# Patient Record
Sex: Male | Born: 1960 | Race: White | Hispanic: No | Marital: Married | Smoking: Never smoker
Health system: Southern US, Community
[De-identification: ages and names within clinical notes are randomized; demographics above are authoritative.]

## PROBLEM LIST (undated history)

## (undated) DIAGNOSIS — M6740 Ganglion, unspecified site: Secondary | ICD-10-CM

## (undated) DIAGNOSIS — K649 Unspecified hemorrhoids: Secondary | ICD-10-CM

## (undated) DIAGNOSIS — K5900 Constipation, unspecified: Secondary | ICD-10-CM

## (undated) DIAGNOSIS — E669 Obesity, unspecified: Secondary | ICD-10-CM

## (undated) DIAGNOSIS — Z8489 Family history of other specified conditions: Secondary | ICD-10-CM

## (undated) DIAGNOSIS — I10 Essential (primary) hypertension: Secondary | ICD-10-CM

## (undated) DIAGNOSIS — K760 Fatty (change of) liver, not elsewhere classified: Secondary | ICD-10-CM

## (undated) DIAGNOSIS — T883XXA Malignant hyperthermia due to anesthesia, initial encounter: Secondary | ICD-10-CM

## (undated) DIAGNOSIS — R911 Solitary pulmonary nodule: Secondary | ICD-10-CM

## (undated) DIAGNOSIS — J189 Pneumonia, unspecified organism: Secondary | ICD-10-CM

## (undated) DIAGNOSIS — C61 Malignant neoplasm of prostate: Secondary | ICD-10-CM

## (undated) DIAGNOSIS — K432 Incisional hernia without obstruction or gangrene: Secondary | ICD-10-CM

## (undated) DIAGNOSIS — R1031 Right lower quadrant pain: Secondary | ICD-10-CM

## (undated) DIAGNOSIS — K5790 Diverticulosis of intestine, part unspecified, without perforation or abscess without bleeding: Secondary | ICD-10-CM

## (undated) DIAGNOSIS — M549 Dorsalgia, unspecified: Secondary | ICD-10-CM

## (undated) DIAGNOSIS — R011 Cardiac murmur, unspecified: Secondary | ICD-10-CM

## (undated) DIAGNOSIS — M1990 Unspecified osteoarthritis, unspecified site: Secondary | ICD-10-CM

## (undated) DIAGNOSIS — G4730 Sleep apnea, unspecified: Secondary | ICD-10-CM

## (undated) DIAGNOSIS — G8929 Other chronic pain: Secondary | ICD-10-CM

## (undated) DIAGNOSIS — R7303 Prediabetes: Secondary | ICD-10-CM

## (undated) DIAGNOSIS — K219 Gastro-esophageal reflux disease without esophagitis: Secondary | ICD-10-CM

## (undated) DIAGNOSIS — K589 Irritable bowel syndrome without diarrhea: Secondary | ICD-10-CM

## (undated) HISTORY — DX: Family history of other specified conditions: Z84.89

## (undated) HISTORY — PX: COLONOSCOPY: SHX174

## (undated) HISTORY — DX: Unspecified osteoarthritis, unspecified site: M19.90

## (undated) HISTORY — DX: Prediabetes: R73.03

## (undated) HISTORY — DX: Essential (primary) hypertension: I10

## (undated) HISTORY — DX: Cardiac murmur, unspecified: R01.1

---

## 2010-02-26 HISTORY — PX: APPENDECTOMY: SHX54

## 2010-03-06 ENCOUNTER — Emergency Department (HOSPITAL_BASED_OUTPATIENT_CLINIC_OR_DEPARTMENT_OTHER): Admission: EM | Admit: 2010-03-06 | Discharge: 2010-03-06 | Payer: Self-pay | Admitting: Emergency Medicine

## 2010-03-06 ENCOUNTER — Ambulatory Visit: Payer: Self-pay | Admitting: Diagnostic Radiology

## 2010-09-06 LAB — URINALYSIS, ROUTINE W REFLEX MICROSCOPIC
Bilirubin Urine: NEGATIVE
Glucose, UA: NEGATIVE mg/dL
Ketones, ur: NEGATIVE mg/dL
Protein, ur: NEGATIVE mg/dL
pH: 5 (ref 5.0–8.0)

## 2010-09-06 LAB — COMPREHENSIVE METABOLIC PANEL
ALT: 51 U/L (ref 0–53)
AST: 47 U/L — ABNORMAL HIGH (ref 0–37)
Alkaline Phosphatase: 76 U/L (ref 39–117)
CO2: 27 mEq/L (ref 19–32)
GFR calc Af Amer: >60 mL/min (ref >60)
GFR calc non Af Amer: >60 mL/min (ref >60)
Glucose, Bld: 118 mg/dL — ABNORMAL HIGH (ref 70–99)
Potassium: 4.5 mEq/L (ref 3.5–5.1)
Sodium: 142 mEq/L (ref 135–145)

## 2010-09-06 LAB — CBC
HCT: 40.1 % (ref 39.0–52.0)
Hemoglobin: 13.7 g/dL (ref 13.0–17.0)
RBC: 4.67 MIL/uL (ref 4.22–5.81)
WBC: 15.9 10*3/uL — ABNORMAL HIGH (ref 4.0–10.5)

## 2010-09-06 LAB — CULTURE, BLOOD (ROUTINE X 2): Culture  Setup Time: 201109140214

## 2010-09-06 LAB — DIFFERENTIAL
Basophils Relative: 1 % (ref 0–1)
Eosinophils Absolute: 0.2 10*3/uL (ref 0.0–0.7)
Eosinophils Relative: 1 % (ref 0–5)
Monocytes Relative: 6 % (ref 3–12)
Neutrophils Relative %: 79 % — ABNORMAL HIGH (ref 43–77)

## 2010-09-06 LAB — LIPASE, BLOOD: Lipase: 56 U/L (ref 23–300)

## 2011-11-15 ENCOUNTER — Encounter (INDEPENDENT_AMBULATORY_CARE_PROVIDER_SITE_OTHER): Payer: Self-pay | Admitting: Surgery

## 2011-11-20 ENCOUNTER — Ambulatory Visit (INDEPENDENT_AMBULATORY_CARE_PROVIDER_SITE_OTHER): Payer: PRIVATE HEALTH INSURANCE | Admitting: Surgery

## 2011-11-20 ENCOUNTER — Encounter (INDEPENDENT_AMBULATORY_CARE_PROVIDER_SITE_OTHER): Payer: Self-pay | Admitting: Surgery

## 2011-11-20 VITALS — BP 132/96 | HR 72 | Temp 98.8°F | Ht 75.0 in | Wt 305.0 lb

## 2011-11-20 DIAGNOSIS — Z9889 Other specified postprocedural states: Secondary | ICD-10-CM

## 2011-11-20 DIAGNOSIS — K432 Incisional hernia without obstruction or gangrene: Secondary | ICD-10-CM

## 2011-11-20 DIAGNOSIS — Z9049 Acquired absence of other specified parts of digestive tract: Secondary | ICD-10-CM | POA: Insufficient documentation

## 2011-11-20 NOTE — Progress Notes (Signed)
Subjective:     Patient ID: Edward Arroyo, male   DOB: 09-06-1960, 51 y.o.   MRN: [id number]  HPI Mr. Gipe had any open appendectomy in [place] in September of 2011. I followed him for a postoperative wound infection until the wound totally healed. Since then he has always noticed a fullness in the right lower quadrant. He is now having increasing pain in the area and constipation. He also has occasional nausea. He had a colonoscopy 3 years ago which was normal  Review of Systems     Objective:   Physical Exam On exam, his abdomen is obese. His incision is well-healed. There is a moderate fullness around the incision which is tender. I cannot reduce this. It is difficult to tell there is a fascial defect. The rest of his abdomen is soft and nontender    Assessment:     Probable incisional hernia    Plan:     I discussed going ahead and proceeding with a diagnostic laparoscopy versus a CAT scan of the abdomen and pelvis. He was proceed conservatively which fills very reasonable. We will ask a CAT scan of the abdomen and pelvis with contrast to indeed see whether there is a hernia present or some other intra-abdominal abnormality creating this fullness and symptoms.

## 2011-11-28 ENCOUNTER — Ambulatory Visit
Admission: RE | Admit: 2011-11-28 | Discharge: 2011-11-28 | Disposition: A | Discharge status: Home / Self Care | Payer: PRIVATE HEALTH INSURANCE | Source: Ambulatory Visit | Attending: Surgery | Admitting: Surgery

## 2011-11-28 DIAGNOSIS — K432 Incisional hernia without obstruction or gangrene: Secondary | ICD-10-CM

## 2011-11-28 MED ORDER — IOHEXOL 300 MG/ML  SOLN
125.0000 mL | Freq: Once | INTRAMUSCULAR | Status: AC | PRN
  Administered 2011-11-28: 125 mL via INTRAVENOUS

## 2011-11-29 ENCOUNTER — Other Ambulatory Visit (INDEPENDENT_AMBULATORY_CARE_PROVIDER_SITE_OTHER): Payer: Self-pay | Admitting: Surgery

## 2011-12-30 ENCOUNTER — Telehealth (INDEPENDENT_AMBULATORY_CARE_PROVIDER_SITE_OTHER): Payer: Self-pay | Admitting: General Surgery

## 2011-12-30 NOTE — Telephone Encounter (Signed)
Pt calling to describe pain (intermitant and sharp) on the Lt side.  He is passing gas, but admits he in not having regular bowel movements.  Recommended he try Miralax 1-2 capfuls in 10 oz fluid, 1-2 times a day.  Continue until having regular BMs, then can decrease to 1 capful in 10oz daily.  He understands and will try; will call back if no improvement.

## 2012-01-08 ENCOUNTER — Encounter (HOSPITAL_COMMUNITY): Payer: Self-pay | Admitting: Pharmacy Technician

## 2012-01-08 ENCOUNTER — Telehealth (INDEPENDENT_AMBULATORY_CARE_PROVIDER_SITE_OTHER): Payer: Self-pay | Admitting: General Surgery

## 2012-01-08 NOTE — Telephone Encounter (Signed)
Pt called this morning and wanted to know if he do anything else for his bowel movement. I was working Dr Janee Morn this morning and I asked him what the pt could take for his bowels and he stated that pt could do Milk O Magnesia. Pt was not having any fever or nausea and vomiting. He stated that Miralax was not working for him. Pt is schedule for hernia surgery on July 24

## 2012-01-10 ENCOUNTER — Ambulatory Visit (HOSPITAL_COMMUNITY)
Admission: RE | Admit: 2012-01-10 | Discharge: 2012-01-10 | Disposition: A | Discharge status: Home / Self Care | Payer: PRIVATE HEALTH INSURANCE | Source: Ambulatory Visit | Attending: Surgery | Admitting: Surgery

## 2012-01-10 ENCOUNTER — Encounter (HOSPITAL_COMMUNITY): Payer: Self-pay

## 2012-01-10 ENCOUNTER — Encounter (HOSPITAL_COMMUNITY)
Admission: RE | Admit: 2012-01-10 | Discharge: 2012-01-10 | Disposition: A | Discharge status: Home / Self Care | Payer: PRIVATE HEALTH INSURANCE | Source: Ambulatory Visit | Attending: Surgery | Admitting: Surgery

## 2012-01-10 DIAGNOSIS — R911 Solitary pulmonary nodule: Secondary | ICD-10-CM | POA: Insufficient documentation

## 2012-01-10 DIAGNOSIS — Z01812 Encounter for preprocedural laboratory examination: Secondary | ICD-10-CM | POA: Insufficient documentation

## 2012-01-10 DIAGNOSIS — K432 Incisional hernia without obstruction or gangrene: Secondary | ICD-10-CM | POA: Insufficient documentation

## 2012-01-10 DIAGNOSIS — I10 Essential (primary) hypertension: Secondary | ICD-10-CM | POA: Insufficient documentation

## 2012-01-10 HISTORY — DX: Sleep apnea, unspecified: G47.30

## 2012-01-10 HISTORY — DX: Family history of other specified conditions: Z84.89

## 2012-01-10 HISTORY — DX: Gastro-esophageal reflux disease without esophagitis: K21.9

## 2012-01-10 HISTORY — DX: Malignant hyperthermia due to anesthesia, initial encounter: T88.3XXA

## 2012-01-10 HISTORY — DX: Incisional hernia without obstruction or gangrene: K43.2

## 2012-01-10 HISTORY — DX: Unspecified hemorrhoids: K64.9

## 2012-01-10 HISTORY — DX: Constipation, unspecified: K59.00

## 2012-01-10 LAB — BASIC METABOLIC PANEL
BUN: 14 mg/dL (ref 6–23)
CO2: 26 mEq/L (ref 19–32)
Chloride: 102 mEq/L (ref 96–112)
GFR calc non Af Amer: >90 mL/min (ref >90)
Glucose, Bld: 132 mg/dL — ABNORMAL HIGH (ref 70–99)
Potassium: 3.8 mEq/L (ref 3.5–5.1)
Sodium: 138 mEq/L (ref 135–145)

## 2012-01-10 LAB — CBC
HCT: 41.9 % (ref 39.0–52.0)
Hemoglobin: 14 g/dL (ref 13.0–17.0)
MCHC: 33.4 g/dL (ref 30.0–36.0)
RBC: 4.89 MIL/uL (ref 4.22–5.81)

## 2012-01-10 LAB — SURGICAL PCR SCREEN
MRSA, PCR: NEGATIVE
Staphylococcus aureus: NEGATIVE

## 2012-01-10 NOTE — Pre-Procedure Instructions (Signed)
PT HAS EKG REPORT FROM DR. BURNETT - DONE 07/26/11.  REPORT ON PT'S CHART. CXR, CBC, BMET WERE DONE TODAY AT [facility] PREOP. PREOP TEACHING WAS DISCUSSED WITH PT USING THE TEACH BACK METHOD.

## 2012-01-10 NOTE — Progress Notes (Signed)
01/10/12 0900  OBSTRUCTIVE SLEEP APNEA  Have you ever been diagnosed with sleep apnea through a sleep study? No  Do you snore loudly (loud enough to be heard through closed doors)?  0  Do you often feel tired, fatigued, or sleepy during the daytime? 0  Has anyone observed you stop breathing during your sleep? 0  Do you have, or are you being treated for high blood pressure? 1  BMI more than 35 kg/m2? 1  Age over 51 years old? 1  Neck circumference greater than 40 cm/18 inches? 1 (18.5")  Gender: 1  Obstructive Sleep Apnea Score 5   Score 4 or greater  Updated health history

## 2012-01-10 NOTE — Patient Instructions (Signed)
YOUR SURGERY IS SCHEDULED ON:  WED  7/24  AT 8:30 AM  REPORT TO [facility] AT: 6:30 AM      PHONE # FOR SHORT STAY IS [phone]  DO NOT EAT OR DRINK ANYTHING AFTER MIDNIGHT THE NIGHT BEFORE YOUR SURGERY.  YOU MAY BRUSH YOUR TEETH, RINSE OUT YOUR MOUTH--BUT NO WATER, NO FOOD, NO CHEWING GUM, NO MINTS, NO CANDIES, NO CHEWING TOBACCO.  PLEASE TAKE THE FOLLOWING MEDICATIONS THE AM OF YOUR SURGERY WITH A FEW SIPS OF WATER: NO MEDICINES TO TAKE THE AM OF YOUR SURGERY    IF YOU USE INHALERS--USE YOUR INHALERS THE AM OF YOUR SURGERY AND BRING INHALERS TO THE HOSPITAL -TAKE TO SURGERY.    IF YOU ARE DIABETIC:  DO NOT TAKE ANY DIABETIC MEDICATIONS THE AM OF YOUR SURGERY.  IF YOU TAKE INSULIN IN THE EVENINGS--PLEASE ONLY TAKE 1/2 NORMAL EVENING DOSE THE NIGHT BEFORE YOUR SURGERY.  NO INSULIN THE AM OF YOUR SURGERY.  IF YOU HAVE SLEEP APNEA AND USE CPAP OR BIPAP--PLEASE BRING THE MASK --NOT THE MACHINE-NOT THE TUBING   -JUST THE MASK. DO NOT BRING VALUABLES, MONEY, CREDIT CARDS.  CONTACT LENS, DENTURES / PARTIALS, GLASSES SHOULD NOT BE WORN TO SURGERY AND IN MOST CASES-HEARING AIDS WILL NEED TO BE REMOVED.  BRING YOUR GLASSES CASE, ANY EQUIPMENT NEEDED FOR YOUR CONTACT LENS. FOR PATIENTS ADMITTED TO THE HOSPITAL--CHECK OUT TIME THE DAY OF DISCHARGE IS 11:00 AM.  ALL INPATIENT ROOMS ARE PRIVATE - WITH BATHROOM, TELEPHONE, TELEVISION AND WIFI INTERNET. IF YOU ARE BEING DISCHARGED THE SAME DAY OF YOUR SURGERY--YOU CAN NOT DRIVE YOURSELF HOME--AND SHOULD NOT GO HOME ALONE BY TAXI OR BUS.  NO DRIVING OR OPERATING MACHINERY FOR 24 HOURS FOLLOWING ANESTHESIA / PAIN MEDICATIONS.                            SPECIAL INSTRUCTIONS:  CHLORHEXIDINE SOAP SHOWER (other brand names are Betasept and Hibiclens ) PLEASE SHOWER WITH CHLORHEXIDINE THE NIGHT BEFORE YOUR SURGERY AND THE AM OF YOUR SURGERY. DO NOT USE CHLORHEXIDINE ON YOUR FACE OR PRIVATE AREAS--YOU MAY USE YOUR NORMAL SOAP THOSE AREAS AND  YOUR NORMAL SHAMPOO.  WOMEN SHOULD AVOID SHAVING UNDER ARMS AND SHAVING LEGS 48 HOURS BEFORE USING CHLORHEXIDINE TO AVOID SKIN IRRITATION.  DO NOT USE IF ALLERGIC TO CHLORHEXIDINE.  PLEASE READ OVER ANY  FACT SHEETS THAT YOU WERE GIVEN: MRSA INFORMATION

## 2012-01-13 ENCOUNTER — Encounter (HOSPITAL_COMMUNITY): Payer: Self-pay

## 2012-01-13 NOTE — Pre-Procedure Instructions (Signed)
DR. ROSE NOTIFIED OF PT'S SURGERY PLANNED FOR 01/15/12 AND PT'S GRANDSON HAS POSSIBLE HX OF MALIGNANT HYPERTHERMIA--GRANDSON WAS TO HAVE MUSCLE BIOPSY 01/10/12.  OR POSTING NOTIFIED AND WILL PUT A NOTE ON OR SCHEDULE.  ALSO A NOTE IS ON FRONT OF PT'S CHART.

## 2012-01-15 ENCOUNTER — Encounter (HOSPITAL_COMMUNITY): Payer: Self-pay | Admitting: *Deleted

## 2012-01-15 ENCOUNTER — Ambulatory Visit (HOSPITAL_COMMUNITY): Payer: PRIVATE HEALTH INSURANCE | Admitting: Certified Registered Nurse Anesthetist

## 2012-01-15 ENCOUNTER — Encounter (HOSPITAL_COMMUNITY): Payer: Self-pay | Admitting: Certified Registered Nurse Anesthetist

## 2012-01-15 ENCOUNTER — Observation Stay (HOSPITAL_COMMUNITY)
Admission: RE | Admit: 2012-01-15 | Discharge: 2012-01-17 | Disposition: A | Discharge status: Home / Self Care | Payer: PRIVATE HEALTH INSURANCE | Source: Ambulatory Visit | Attending: Surgery | Admitting: Surgery

## 2012-01-15 ENCOUNTER — Encounter (HOSPITAL_COMMUNITY)
Admission: RE | Disposition: A | Discharge status: Home / Self Care | Payer: Self-pay | Source: Ambulatory Visit | Attending: Surgery

## 2012-01-15 DIAGNOSIS — K5900 Constipation, unspecified: Secondary | ICD-10-CM | POA: Insufficient documentation

## 2012-01-15 DIAGNOSIS — I10 Essential (primary) hypertension: Secondary | ICD-10-CM | POA: Insufficient documentation

## 2012-01-15 DIAGNOSIS — K219 Gastro-esophageal reflux disease without esophagitis: Secondary | ICD-10-CM | POA: Insufficient documentation

## 2012-01-15 DIAGNOSIS — E785 Hyperlipidemia, unspecified: Secondary | ICD-10-CM | POA: Insufficient documentation

## 2012-01-15 DIAGNOSIS — K432 Incisional hernia without obstruction or gangrene: Principal | ICD-10-CM | POA: Insufficient documentation

## 2012-01-15 DIAGNOSIS — R7309 Other abnormal glucose: Secondary | ICD-10-CM | POA: Insufficient documentation

## 2012-01-15 DIAGNOSIS — G4730 Sleep apnea, unspecified: Secondary | ICD-10-CM | POA: Insufficient documentation

## 2012-01-15 HISTORY — PX: INCISIONAL HERNIA REPAIR: SHX193

## 2012-01-15 LAB — GLUCOSE, CAPILLARY: Glucose-Capillary: 160 mg/dL — ABNORMAL HIGH (ref 70–99)

## 2012-01-15 SURGERY — REPAIR, HERNIA, INCISIONAL, LAPAROSCOPIC
Anesthesia: General

## 2012-01-15 MED ORDER — GLYCOPYRROLATE 0.2 MG/ML IJ SOLN
INTRAMUSCULAR | Status: DC | PRN
  Administered 2012-01-15: .8 mg via INTRAVENOUS

## 2012-01-15 MED ORDER — ONDANSETRON HCL 4 MG/2ML IJ SOLN
INTRAMUSCULAR | Status: DC | PRN
  Administered 2012-01-15: 4 mg via INTRAVENOUS

## 2012-01-15 MED ORDER — LACTATED RINGERS IV SOLN: INTRAVENOUS | Status: DC

## 2012-01-15 MED ORDER — POTASSIUM CHLORIDE IN NACL 20-0.9 MEQ/L-% IV SOLN
INTRAVENOUS | Status: DC
  Administered 2012-01-15: 12:00:00 via INTRAVENOUS
  Administered 2012-01-16: 75 mL via INTRAVENOUS
  Filled 2012-01-15 (×3): qty 1000

## 2012-01-15 MED ORDER — ACETAMINOPHEN 10 MG/ML IV SOLN
INTRAVENOUS | Status: AC
  Filled 2012-01-15: qty 100

## 2012-01-15 MED ORDER — EPHEDRINE SULFATE 50 MG/ML IJ SOLN
INTRAMUSCULAR | Status: DC | PRN
  Administered 2012-01-15 (×2): 10 mg via INTRAVENOUS
  Administered 2012-01-15: 5 mg via INTRAVENOUS

## 2012-01-15 MED ORDER — NEOSTIGMINE METHYLSULFATE 1 MG/ML IJ SOLN
INTRAMUSCULAR | Status: DC | PRN
  Administered 2012-01-15: 5 mg via INTRAVENOUS

## 2012-01-15 MED ORDER — MIDAZOLAM HCL 5 MG/5ML IJ SOLN
INTRAMUSCULAR | Status: DC | PRN
  Administered 2012-01-15: 2 mg via INTRAVENOUS

## 2012-01-15 MED ORDER — OXYCODONE-ACETAMINOPHEN 5-325 MG PO TABS: 1.0000 | ORAL_TABLET | ORAL | Status: DC | PRN

## 2012-01-15 MED ORDER — CEFAZOLIN SODIUM-DEXTROSE 2-3 GM-% IV SOLR
INTRAVENOUS | Status: AC
  Filled 2012-01-15: qty 50

## 2012-01-15 MED ORDER — ACETAMINOPHEN 10 MG/ML IV SOLN
INTRAVENOUS | Status: DC | PRN
  Administered 2012-01-15: 1000 mg via INTRAVENOUS

## 2012-01-15 MED ORDER — IRBESARTAN 150 MG PO TABS
150.0000 mg | ORAL_TABLET | Freq: Every day | ORAL | Status: DC
  Administered 2012-01-15 – 2012-01-17 (×3): 150 mg via ORAL
  Filled 2012-01-15 (×3): qty 1

## 2012-01-15 MED ORDER — PROPOFOL 10 MG/ML IV EMUL
INTRAVENOUS | Status: DC | PRN
  Administered 2012-01-15: 250 mg via INTRAVENOUS

## 2012-01-15 MED ORDER — FENTANYL CITRATE 0.05 MG/ML IJ SOLN
INTRAMUSCULAR | Status: DC | PRN
  Administered 2012-01-15: 100 ug via INTRAVENOUS
  Administered 2012-01-15 (×3): 50 ug via INTRAVENOUS

## 2012-01-15 MED ORDER — ROCURONIUM BROMIDE 100 MG/10ML IV SOLN
INTRAVENOUS | Status: DC | PRN
  Administered 2012-01-15: 40 mg via INTRAVENOUS

## 2012-01-15 MED ORDER — BUPIVACAINE HCL (PF) 0.5 % IJ SOLN
INTRAMUSCULAR | Status: DC | PRN
  Administered 2012-01-15: 26 mL

## 2012-01-15 MED ORDER — HYDROCHLOROTHIAZIDE 12.5 MG PO CAPS
12.5000 mg | ORAL_CAPSULE | Freq: Every day | ORAL | Status: DC
  Administered 2012-01-15 – 2012-01-17 (×3): 12.5 mg via ORAL
  Filled 2012-01-15 (×3): qty 1

## 2012-01-15 MED ORDER — ONDANSETRON HCL 4 MG/2ML IJ SOLN
4.0000 mg | Freq: Four times a day (QID) | INTRAMUSCULAR | Status: DC | PRN
  Administered 2012-01-15: 4 mg via INTRAVENOUS
  Filled 2012-01-15: qty 2

## 2012-01-15 MED ORDER — CEFAZOLIN SODIUM 1-5 GM-% IV SOLN
INTRAVENOUS | Status: AC
  Filled 2012-01-15: qty 50

## 2012-01-15 MED ORDER — LIDOCAINE HCL 4 % MT SOLN
OROMUCOSAL | Status: DC | PRN
  Administered 2012-01-15: 4 mL via TOPICAL

## 2012-01-15 MED ORDER — DEXTROSE 5 % IV SOLN
3.0000 g | INTRAVENOUS | Status: AC
  Administered 2012-01-15: 3 g via INTRAVENOUS
  Filled 2012-01-15: qty 3000

## 2012-01-15 MED ORDER — BUPIVACAINE HCL (PF) 0.5 % IJ SOLN
INTRAMUSCULAR | Status: AC
  Filled 2012-01-15: qty 30

## 2012-01-15 MED ORDER — HYDROMORPHONE HCL PF 1 MG/ML IJ SOLN: 1.0000 mg | INTRAMUSCULAR | Status: DC | PRN

## 2012-01-15 MED ORDER — ONDANSETRON HCL 4 MG PO TABS: 4.0000 mg | ORAL_TABLET | Freq: Four times a day (QID) | ORAL | Status: DC | PRN

## 2012-01-15 MED ORDER — LACTATED RINGERS IV SOLN
INTRAVENOUS | Status: DC | PRN
  Administered 2012-01-15: 08:00:00 via INTRAVENOUS

## 2012-01-15 MED ORDER — HYDROMORPHONE HCL PF 1 MG/ML IJ SOLN: 0.2500 mg | INTRAMUSCULAR | Status: DC | PRN

## 2012-01-15 MED ORDER — NAPROXEN 500 MG PO TABS
500.0000 mg | ORAL_TABLET | Freq: Three times a day (TID) | ORAL | Status: DC | PRN
  Filled 2012-01-15: qty 1

## 2012-01-15 MED ORDER — OLMESARTAN MEDOXOMIL-HCTZ 40-25 MG PO TABS: 0.5000 | ORAL_TABLET | Freq: Every day | ORAL | Status: DC

## 2012-01-15 MED ORDER — PROMETHAZINE HCL 25 MG/ML IJ SOLN: 6.2500 mg | INTRAMUSCULAR | Status: DC | PRN

## 2012-01-15 MED ORDER — ENOXAPARIN SODIUM 40 MG/0.4ML ~~LOC~~ SOLN
40.0000 mg | SUBCUTANEOUS | Status: DC
  Administered 2012-01-16: 40 mg via SUBCUTANEOUS
  Filled 2012-01-15 (×3): qty 0.4

## 2012-01-15 MED ORDER — KETOROLAC TROMETHAMINE 30 MG/ML IJ SOLN
30.0000 mg | Freq: Four times a day (QID) | INTRAMUSCULAR | Status: AC
  Administered 2012-01-15 – 2012-01-16 (×3): 30 mg via INTRAVENOUS
  Filled 2012-01-15 (×3): qty 1

## 2012-01-15 SURGICAL SUPPLY — 48 items
APL SKNCLS STERI-STRIP NONHPOA (GAUZE/BANDAGES/DRESSINGS) ×1
BANDAGE ADHESIVE 1X3 (GAUZE/BANDAGES/DRESSINGS) ×6 IMPLANT
BENZOIN TINCTURE PRP APPL 2/3 (GAUZE/BANDAGES/DRESSINGS) ×2 IMPLANT
BINDER ABD UNIV 12 45-62 (WOUND CARE) IMPLANT
BINDER ABDOMINAL 46IN 62IN (WOUND CARE)
CANISTER SUCTION 2500CC (MISCELLANEOUS) ×2 IMPLANT
CANNULA ENDOPATH XCEL 11M (ENDOMECHANICALS) ×2 IMPLANT
CHLORAPREP W/TINT 26ML (MISCELLANEOUS) ×2 IMPLANT
CLOTH BEACON ORANGE TIMEOUT ST (SAFETY) ×2 IMPLANT
CLSR STERI-STRIP ANTIMIC 1/2X4 (GAUZE/BANDAGES/DRESSINGS) ×1 IMPLANT
DECANTER SPIKE VIAL GLASS SM (MISCELLANEOUS) ×2 IMPLANT
DEVICE SECURE STRAP 25 ABSORB (INSTRUMENTS) ×2 IMPLANT
DEVICE TROCAR PUNCTURE CLOSURE (ENDOMECHANICALS) ×2 IMPLANT
DISSECTOR BLUNT TIP ENDO 5MM (MISCELLANEOUS) IMPLANT
DRAPE LAPAROSCOPIC ABDOMINAL (DRAPES) ×2 IMPLANT
DRSG TEGADERM 2-3/8X2-3/4 SM (GAUZE/BANDAGES/DRESSINGS) IMPLANT
ELECT REM PT RETURN 9FT ADLT (ELECTROSURGICAL) ×2
ELECTRODE REM PT RTRN 9FT ADLT (ELECTROSURGICAL) ×1 IMPLANT
GLOVE BIOGEL PI IND STRL 7.0 (GLOVE) ×1 IMPLANT
GLOVE BIOGEL PI INDICATOR 7.0 (GLOVE) ×1
GLOVE SURG SIGNA 7.5 PF LTX (GLOVE) ×4 IMPLANT
GOWN STRL NON-REIN LRG LVL3 (GOWN DISPOSABLE) ×2 IMPLANT
GOWN STRL REIN XL XLG (GOWN DISPOSABLE) ×4 IMPLANT
HAND ACTIVATED (MISCELLANEOUS) IMPLANT
KIT BASIN OR (CUSTOM PROCEDURE TRAY) ×2 IMPLANT
MESH VENTRALIGHT ST 6X8 (Mesh Specialty) ×2 IMPLANT
MESH VENTRLGHT ELLIPSE 8X6XMFL (Mesh Specialty) IMPLANT
NDL INSUFFLATION 14GA 120MM (NEEDLE) IMPLANT
NDL SPNL 22GX3.5 QUINCKE BK (NEEDLE) ×1 IMPLANT
NEEDLE INSUFFLATION 14GA 120MM (NEEDLE) ×2 IMPLANT
NEEDLE SPNL 22GX3.5 QUINCKE BK (NEEDLE) ×2 IMPLANT
NS IRRIG 1000ML POUR BTL (IV SOLUTION) ×2 IMPLANT
PEN SKIN MARKING BROAD (MISCELLANEOUS) ×2 IMPLANT
PENCIL BUTTON HOLSTER BLD 10FT (ELECTRODE) IMPLANT
SCISSORS LAP 5X35 DISP (ENDOMECHANICALS) ×1 IMPLANT
SET IRRIG TUBING LAPAROSCOPIC (IRRIGATION / IRRIGATOR) IMPLANT
SOLUTION ANTI FOG 6CC (MISCELLANEOUS) ×2 IMPLANT
STRIP CLOSURE SKIN 1/2X4 (GAUZE/BANDAGES/DRESSINGS) ×4 IMPLANT
SUT MNCRL AB 4-0 PS2 18 (SUTURE) IMPLANT
SUT NOVA 0 T19/GS 22DT (SUTURE) IMPLANT
SUT NOVA NAB DX-16 0-1 5-0 T12 (SUTURE) ×1 IMPLANT
TOWEL OR 17X26 10 PK STRL BLUE (TOWEL DISPOSABLE) ×2 IMPLANT
TRAY FOLEY CATH 14FRSI W/METER (CATHETERS) ×1 IMPLANT
TRAY LAP CHOLE (CUSTOM PROCEDURE TRAY) ×2 IMPLANT
TROCAR BLADELESS OPT 5 75 (ENDOMECHANICALS) ×3 IMPLANT
TROCAR XCEL BLUNT TIP 100MML (ENDOMECHANICALS) IMPLANT
TROCAR XCEL NON-BLD 11X100MML (ENDOMECHANICALS) ×2 IMPLANT
TUBING INSUFFLATION 10FT LAP (TUBING) ×2 IMPLANT

## 2012-01-15 NOTE — Anesthesia Postprocedure Evaluation (Signed)
Anesthesia Post Note  Patient: Edward Arroyo  Procedure(s) Performed: Procedure(s) (LRB): LAPAROSCOPIC INCISIONAL HERNIA (N/A) INSERTION OF MESH (N/A)  Anesthesia type: General  Patient location: PACU  Post pain: Pain level controlled  Post assessment: Post-op Vital signs reviewed  Last Vitals:  Filed Vitals:   01/15/12 0945  BP: 127/64  Pulse: 53  Temp:   Resp: 16    Post vital signs: Reviewed  Level of consciousness: sedated  Complications: No apparent anesthesia complications

## 2012-01-15 NOTE — Anesthesia Preprocedure Evaluation (Signed)
Anesthesia Evaluation  Patient identified by MRN, date of birth, ID band Patient awake    Reviewed: Allergy & Precautions, H&P , NPO status , Patient's Chart, lab work & pertinent test results  History of Anesthesia Complications Negative for: history of anesthetic complications (Negative final history of anesthetic complications; grandson worked up for [facility] with negative muscle biopsy)  Airway Mallampati: II TM Distance: >3 FB Neck ROM: Full    Dental  (+) Teeth Intact and Dental Advisory Given   Pulmonary neg pulmonary ROS, sleep apnea ,  breath sounds clear to auscultation  Pulmonary exam normal       Cardiovascular hypertension, Rhythm:Regular Rate:Normal     Neuro/Psych negative neurological ROS  negative psych ROS   GI/Hepatic Neg liver ROS, GERD-  ,  Endo/Other  Type obesity  Renal/GU negative Renal ROS  negative genitourinary   Musculoskeletal negative musculoskeletal ROS (+)   Abdominal   Peds negative pediatric ROS (+)  Hematology negative hematology ROS (+)   Anesthesia Other Findings   Reproductive/Obstetrics negative OB ROS                           Anesthesia Physical Anesthesia Plan  ASA: III  Anesthesia Plan: General   Post-op Pain Management:    Induction: Intravenous  Airway Management Planned: Oral ETT  Additional Equipment:   Intra-op Plan:   Post-operative Plan: Extubation in OR  Informed Consent: I have reviewed the patients History and Physical, chart, labs and discussed the procedure including the risks, benefits and alternatives for the proposed anesthesia with the patient or authorized representative who has indicated his/her understanding and acceptance.   Dental advisory given  Plan Discussed with: CRNA  Anesthesia Plan Comments:         Anesthesia Quick Evaluation

## 2012-01-15 NOTE — Op Note (Signed)
LAPAROSCOPIC INCISIONAL HERNIA, INSERTION OF MESH  Procedure Note  Berel Najjar 01/15/2012   Pre-op Diagnosis: incisional hernia     Post-op Diagnosis: same  Procedure(s): LAPAROSCOPIC INCISIONAL HERNIA INSERTION OF MESH (15 cm x 20 cm Bard ventralite)  Surgeon(s): Shelly Rubenstein, MD  Anesthesia: General  Staff:  Jaynee Eagles - Circulator Beryl Meager, CST - Scrub Person Michaela A Hubbard, CST - Scrub Person  Estimated Blood Loss: Minimal               Findings: Patient was found to have a large incisional hernia in the right lower quadrant. There was only omentum fixed to the hernia with no evidence of bowel involvement.  Procedure: The patient was brought to the operating room and identified as the correct patient. He was placed supine on the operating room table and general anesthesia was induced. His abdomen was then prepped and draped in the usual sterile fashion. A small incision was made in the patient's left upper quadrant with a scalpel. I then used the 5 mm Optiview port and camera to sew again accessed through all layers of the abdominal wall and into the peritoneal cavity. Insufflation of the abdomen was then begun. I evaluated the introduction site and saw no evidence of bowel injury. I could easily visualize the large hernia in the right lower quadrant. All the bowel was reduced from the hernia and only omentum was stuck in the hernia sac. A posterior thumb ulnar port in the patient's left lower quadrant an 11 mm port the patient's left mid quadrant.  I then was easily able to take down the minimal omentum fixated in the hernia sac with laparoscopic scissors.  I then measured the size of the defect. A piece of 15 cm x 20 cm Bard ventral light mesh was brought to the field. I placed a 0 Novafil suture in 2 separate corners of the mesh. The mesh was then soaked in saline and introduced through the 11 mm trocar into the abdominal cavity under direct vision. I  then unrolled the mesh. I made 2 separate skin incisions and used the suture passer to pull the ends of the suture up to the abdominal wall. The sutures were tied in place helping to fixate the mesh to the perineal surface. I then used the absorbable tacks were detected the mesh and circumferentially around the fascial defect. Wide coverage circumferentially around the fascial defect appeared to be achieved. I can evaluate abdominal cavity and saw no evidence of injury and hemostasis appeared to be achieved. All ports were removed under direct vision the abdomen was deflated. The mesh appeared to lay properly in place. All incisions were anesthetized with Marcaine and then closed with 4-0 Monocryl subcuticular sutures. Steri-Strips and Band-Aids were then applied. The patient tolerated the procedure well. All the counts were correct at the end of the procedure. The patient was then extubated in the operating room and taken in a stable condition to the recovery room.          Laxmi Choung A   Date: 01/15/2012  Time: 9:07 AM

## 2012-01-15 NOTE — Progress Notes (Signed)
Edward Arroyo does not have malignant hypertension had a negative test result on 01-10-12 Friday

## 2012-01-15 NOTE — Transfer of Care (Signed)
Immediate Anesthesia Transfer of Care Note  Patient: Edward Arroyo  Procedure(s) Performed: Procedure(s) (LRB): LAPAROSCOPIC INCISIONAL HERNIA (N/A) INSERTION OF MESH (N/A)  Patient Location: PACU  Anesthesia Type: General  Level of Consciousness: sedated, patient cooperative and responds to stimulaton  Airway & Oxygen Therapy: Patient Spontanous Breathing and Patient connected to face mask oxgen  Post-op Assessment: Report given to PACU RN and Post -op Vital signs reviewed and stable  Post vital signs: Reviewed and stable  Complications: No apparent anesthesia complications

## 2012-01-15 NOTE — Interval H&P Note (Signed)
History and Physical Interval Note: no change  01/15/2012 7:50 AM  Edward Arroyo  has presented today for surgery, with the diagnosis of incisional hernia  The various methods of treatment have been discussed with the patient and family. After consideration of risks, benefits and other options for treatment, the patient has consented to  Procedure(s) (LRB): LAPAROSCOPIC INCISIONAL HERNIA (N/A) INSERTION OF MESH (N/A) as a surgical intervention .  The patient's history has been reviewed, patient examined, no change in status, stable for surgery.  I have reviewed the patient's chart and labs.  Questions were answered to the patient's satisfaction.     [name] A

## 2012-01-15 NOTE — H&P (Signed)
Edward Arroyo is an 51 y.o. male.   Chief Complaint: incisional hernia HPI: Patient had previous appendectomy elsewhere and had postop wound infection.  He has since developed a symptomatic incisional hernia in the RLQ confirmed on CT scan.  He now presents for laparoscopic repair  Past Medical History  Diagnosis Date  . Hypertension   . Hyperlipidemia   . Heart murmur     AS A CHILD  . Diabetes mellitus     "BORDERLINE"  PT NOT ON ANY MEDICATIONS  . GERD (gastroesophageal reflux disease)     RECENT PROBLEMS-NO MEDS  . Constipation     PT HOPING CONSTIPATION IS RELATED TO HERNIA - ON DAILY LAXATIVE  . Arthritis     PT STATES A LOT OF JOINT PAIN-SUSPECTS ARTHRITIS  . Incisional hernia     S/P APPENDECTOMY  . Family history of anesthesia complication     PT'S GRANDSON -POSSIBLE MALIGNANT HYPERTHERMIA--GRANDSON HAVING MUSCLE BIOPSY TODAY  01/10/12   . Hemorrhoids   . Sleep apnea     STOP BANG SCORE OF 5  . Malignant hyperthermia     SEE FAMILY HX OF GRANDSON POSSIBLE MH. PT HAD APPENDECTOMY SEPT 2011 AT [facility] [place] HE WAS GIVEN SPINAL -BECAUSE OF GRANDSON'S HISTORY OF "POSSIBLE" MALIGNANT HYPERTHERMIA.  PT STATES HE WAS TOLD THE SPINAL WAS HARD TO  PLACE-THAT HE HAD CALCIFICATIONS.  PT STATES HIS THIGH'S HAVE HAD SOME NUMBNESS SINCE THE SPINAL.    Past Surgical History  Procedure Date  . Appendectomy 02-26-2010    Family History  Problem Relation Age of Onset  . Cancer Father     lung/prostate  . Cancer Mother     lung   Social History:  reports that he has never smoked. He has never used smokeless tobacco. He reports that he does not drink alcohol or use illicit drugs.  Allergies: No Known Allergies  No prescriptions prior to admission    No results found for this or any previous visit (from the past 48 hour(s)). No results found.  Review of Systems  Constitutional: Negative.   HENT: Negative.   Eyes: Negative.   Respiratory: Negative.     Gastrointestinal: Positive for abdominal pain and constipation.  Genitourinary: Negative.   Musculoskeletal: Negative.   Skin: Negative.   Neurological: Negative.   Endo/Heme/Allergies: Negative.   Psychiatric/Behavioral: Negative.     There were no vitals taken for this visit. Physical Exam  Constitutional: He is oriented to person, place, and time. He appears well-developed and well-nourished. No distress.  HENT:  Head: Normocephalic and atraumatic.  Eyes: Conjunctivae are normal. Pupils are equal, round, and reactive to light.  Neck: Normal range of motion. Neck supple.  Cardiovascular: Normal rate, regular rhythm, normal heart sounds and intact distal pulses.   No murmur heard. Respiratory: Effort normal and breath sounds normal. No respiratory distress. He has no wheezes. He has no rales.  GI: Soft. Bowel sounds are normal. He exhibits no distension. There is no tenderness.       Hernia at RLQ incision site  Musculoskeletal: Normal range of motion. He exhibits no edema and no tenderness.  Neurological: He is alert and oriented to person, place, and time.  Skin: Skin is warm and dry. No rash noted. No erythema.  Psychiatric: His behavior is normal. Judgment normal.     Assessment/Plan Incisional hernia  Laparoscopic repair with mesh is recommended.  I discussed the risks which include but is not limited to bleeding, infection, injury  to surrounding structures, need to convert to an open procedure, need for further surgery, recurrence, etc.  He understands and wishes to proceed.  Likelihood of success is good.  Neviah Braud A 01/15/2012, 6:24 AM

## 2012-01-16 ENCOUNTER — Encounter (HOSPITAL_COMMUNITY): Payer: Self-pay | Admitting: Surgery

## 2012-01-16 MED ORDER — KETOROLAC TROMETHAMINE 30 MG/ML IJ SOLN
30.0000 mg | Freq: Four times a day (QID) | INTRAMUSCULAR | Status: AC
  Administered 2012-01-16 – 2012-01-17 (×3): 30 mg via INTRAVENOUS
  Filled 2012-01-16 (×3): qty 1

## 2012-01-16 NOTE — Progress Notes (Signed)
UR done. 

## 2012-01-16 NOTE — Progress Notes (Signed)
1 Day Post-Op  Subjective: POD#1 Complains of soreness.  Still needing IV pain meds  Objective: Vital signs in last 24 hours: Temp:  [97.5 F (36.4 C)-99 F (37.2 C)] 98.7 F (37.1 C) (07/25 0600) Pulse Rate:  [45-62] 58  (07/25 0600) Resp:  [9-20] 18  (07/25 0600) BP: (110-151)/(57-95) 111/63 mmHg (07/25 0600) SpO2:  [93 %-100 %] 93 % (07/25 0600) Weight:  [306 lb (138.801 kg)] 306 lb (138.801 kg) (07/24 1127) Last BM Date: 01/15/12  Intake/Output from previous day: 07/24 0701 - 07/25 0700 In: 1780 [P.O.:480; I.V.:1300] Out: 1525 [Urine:1525] Intake/Output this shift:    Abdomen soft, non distended, dressing dry  Lab Results:  No results found for this basename: WBC:2,HGB:2,HCT:2,PLT:2 in the last 72 hours BMET No results found for this basename: NA:2,K:2,CL:2,CO2:2,GLUCOSE:2,BUN:2,CREATININE:2,CALCIUM:2 in the last 72 hours PT/INR No results found for this basename: LABPROT:2,INR:2 in the last 72 hours ABG No results found for this basename: PHART:2,PCO2:2,PO2:2,HCO3:2 in the last 72 hours  Studies/Results: No results found.  Anti-infectives: Anti-infectives     Start     Dose/Rate Route Frequency Ordered Stop   01/15/12 0656   ceFAZolin (ANCEF) 3 g in dextrose 5 % 50 mL IVPB        3 g 160 mL/hr over 30 Minutes Intravenous 30 min pre-op 01/15/12 0643 01/15/12 0814          Assessment/Plan: s/p Procedure(s) (LRB): LAPAROSCOPIC INCISIONAL HERNIA (N/A) INSERTION OF MESH (N/A)  Continue IV pain meds Decrease IV fluids Keep until tomorrow  LOS: 1 day    Edward Arroyo A 01/16/2012

## 2012-01-17 MED ORDER — HYDROCODONE-ACETAMINOPHEN 5-325 MG PO TABS: 1.0000 | ORAL_TABLET | ORAL | Status: AC | PRN

## 2012-01-17 NOTE — Plan of Care (Signed)
Problem: Phase I Progression Outcomes Goal: OOB as tolerated unless otherwise ordered Outcome: Completed/Met Date Met:  01/17/12 Ambulating around nursing unit independently. Goal: Initial discharge plan identified Outcome: Completed/Met Date Met:  01/17/12 Plans to go home Friday.

## 2012-01-17 NOTE — Discharge Summary (Signed)
Physician Discharge Summary  Patient ID: Edward Arroyo MRN: [id number] DOB/AGE: 09/12/1960 51 y.o.  Admit date: 01/15/2012 Discharge date: 01/17/2012  Admission Diagnoses:  Incisional hernia  Discharge Diagnoses: incisional hernia Active Problems:  * No active hospital problems. *    Discharged Condition: good  Hospital Course: admitted post elective lap hernia repair.  Kept an extra day for pain control.  Discharged home POD#2  Consults: None  Significant Diagnostic Studies:   Treatments: surgery: laparoscopic incisional hernia repair with mesh  Discharge Exam: Blood pressure 133/85, pulse 62, temperature 98.2 F (36.8 C), temperature source Oral, resp. rate 18, height 6\' 3"  (1.905 m), weight 306 lb (138.801 kg), SpO2 97.00%. General appearance: alert and no distress GI: soft, non-tender; bowel sounds normal; no masses,  no organomegaly Incision/Wound: clean  Disposition: Final discharge disposition not confirmed   Medication List  As of 01/17/2012  6:41 AM   TAKE these medications         acetaminophen 500 MG tablet   Commonly known as: TYLENOL   Take 500 mg by mouth every 6 (six) hours as needed. pain      HYDROcodone-acetaminophen 5-325 MG per tablet   Commonly known as: NORCO/VICODIN   Take 1-2 tablets by mouth every 4 (four) hours as needed for pain.      MILK OF MAGNESIA PO   Take by mouth. 4 tablespoons daily      olmesartan-hydrochlorothiazide 40-25 MG per tablet   Commonly known as: BENICAR HCT   Take 0.5 tablets by mouth daily with breakfast.      polyethylene glycol packet   Commonly known as: MIRALAX / GLYCOLAX   Take 17 g by mouth daily.           Follow-up Information    Follow up with [facility] A, MD. Call in 3 weeks.   Contact information:   [employer] [facility] [address] [place] [address] [phone]          Signed: Shelly Rubenstein 01/17/2012, 6:41 AM

## 2012-01-17 NOTE — Progress Notes (Signed)
Patient ID: Edward Arroyo, male   DOB: [birth year], 51 y.o.   MRN: [id number] POD#2 Doing well, much less pain  Abdomen soft  Plan:  discharge

## 2012-01-20 ENCOUNTER — Telehealth (INDEPENDENT_AMBULATORY_CARE_PROVIDER_SITE_OTHER): Payer: Self-pay

## 2012-01-20 NOTE — Telephone Encounter (Signed)
Patient returning your call back. Thinks it was or appointment but not sure. Best number is [phone].

## 2012-02-03 ENCOUNTER — Ambulatory Visit (INDEPENDENT_AMBULATORY_CARE_PROVIDER_SITE_OTHER): Payer: PRIVATE HEALTH INSURANCE | Admitting: Surgery

## 2012-02-03 ENCOUNTER — Encounter (INDEPENDENT_AMBULATORY_CARE_PROVIDER_SITE_OTHER): Payer: Self-pay | Admitting: Surgery

## 2012-02-03 VITALS — BP 127/80 | HR 77 | Temp 97.8°F | Resp 16 | Ht 75.0 in | Wt 293.4 lb

## 2012-02-03 DIAGNOSIS — Z09 Encounter for follow-up examination after completed treatment for conditions other than malignant neoplasm: Secondary | ICD-10-CM

## 2012-02-03 NOTE — Progress Notes (Signed)
Subjective:     Patient ID: Edward Arroyo, male   DOB: September 27, 1960, 51 y.o.   MRN: [id number]  HPI He is here for his first postop visit status post laparoscopic incisional hernia repair with mesh for a right lower quadrant hernia from a previous appendectomy. He is still having moderate discomfort but no obstructive symptoms  Review of Systems     Objective:   Physical Exam On exam, his abdomen is soft and there is no evidence of recurrent hernia    Assessment:     Patient status post laparoscopic incisional hernia repair with mesh    Plan:     I am going to see him back the first week in September prior to his return to work. He will refrain from heavy lifting until he and

## 2012-02-18 ENCOUNTER — Ambulatory Visit (INDEPENDENT_AMBULATORY_CARE_PROVIDER_SITE_OTHER): Payer: PRIVATE HEALTH INSURANCE | Admitting: Surgery

## 2012-02-18 ENCOUNTER — Encounter (INDEPENDENT_AMBULATORY_CARE_PROVIDER_SITE_OTHER): Payer: Self-pay | Admitting: Surgery

## 2012-02-18 VITALS — BP 120/64 | HR 64 | Temp 99.5°F | Resp 20 | Ht 75.0 in | Wt 289.4 lb

## 2012-02-18 DIAGNOSIS — Z09 Encounter for follow-up examination after completed treatment for conditions other than malignant neoplasm: Secondary | ICD-10-CM

## 2012-02-18 NOTE — Progress Notes (Signed)
Subjective:     Patient ID: Edward Arroyo, male   DOB: [birth year], 51 y.o.   MRN: [id number]  HPI He is here for another postop visit status post laparoscopic incisional hernia repair with mesh. He still has a significant amount of pain in the right lower quadrant from the very large hernia repair. He has no obstructive symptoms.  Review of Systems     Objective:   Physical Exam    On exam, his incisions are well healed and there is no evidence of recurrent hernia. He remains mildly tender with guarding in the right lower quadrant Assessment:     Patient stable postop    Plan:     Because large size of the hernia and his postoperative discomfort, I did not want him returning to work or heavy lifting until September 23. I will see him back as needed and if the pain does not resolve

## 2012-02-27 ENCOUNTER — Encounter (INDEPENDENT_AMBULATORY_CARE_PROVIDER_SITE_OTHER): Payer: PRIVATE HEALTH INSURANCE | Admitting: Surgery

## 2012-03-31 ENCOUNTER — Ambulatory Visit
Admission: RE | Admit: 2012-03-31 | Discharge: 2012-03-31 | Disposition: A | Discharge status: Home / Self Care | Payer: Self-pay | Source: Ambulatory Visit | Attending: Surgery | Admitting: Surgery

## 2012-03-31 ENCOUNTER — Ambulatory Visit (INDEPENDENT_AMBULATORY_CARE_PROVIDER_SITE_OTHER): Payer: 59 | Admitting: Surgery

## 2012-03-31 ENCOUNTER — Other Ambulatory Visit (INDEPENDENT_AMBULATORY_CARE_PROVIDER_SITE_OTHER): Payer: Self-pay | Admitting: Surgery

## 2012-03-31 ENCOUNTER — Encounter (INDEPENDENT_AMBULATORY_CARE_PROVIDER_SITE_OTHER): Payer: Self-pay | Admitting: General Surgery

## 2012-03-31 ENCOUNTER — Encounter (INDEPENDENT_AMBULATORY_CARE_PROVIDER_SITE_OTHER): Payer: Self-pay | Admitting: Surgery

## 2012-03-31 VITALS — BP 144/78 | HR 60 | Temp 98.0°F | Ht 75.0 in | Wt 294.4 lb

## 2012-03-31 DIAGNOSIS — M542 Cervicalgia: Secondary | ICD-10-CM

## 2012-03-31 DIAGNOSIS — Z09 Encounter for follow-up examination after completed treatment for conditions other than malignant neoplasm: Secondary | ICD-10-CM
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
## 2012-04-08 ENCOUNTER — Encounter (INDEPENDENT_AMBULATORY_CARE_PROVIDER_SITE_OTHER): Payer: Self-pay | Admitting: Surgery

## 2012-04-08 ENCOUNTER — Ambulatory Visit (INDEPENDENT_AMBULATORY_CARE_PROVIDER_SITE_OTHER): Payer: PRIVATE HEALTH INSURANCE | Admitting: Surgery

## 2012-04-08 ENCOUNTER — Encounter (INDEPENDENT_AMBULATORY_CARE_PROVIDER_SITE_OTHER): Payer: Self-pay | Admitting: General Surgery

## 2012-04-08 VITALS — BP 134/76 | HR 62 | Temp 97.6°F | Ht 75.0 in | Wt 292.6 lb

## 2012-04-08 DIAGNOSIS — Z09 Encounter for follow-up examination after completed treatment for conditions other than malignant neoplasm: Secondary | ICD-10-CM

## 2012-04-08 NOTE — Progress Notes (Signed)
Subjective:     Patient ID: Edward Arroyo, male   DOB: 1960-12-14, 51 y.o.   MRN: [id number]  HPI He is here for another visit. He is having some constipation and some mild abdominal discomfort at the site of the hernia repair. He has minimal neck pain  Review of Systems     Objective:   Physical Exam On exam, again, I cannot feel a hernia. His abdomen is soft and nontender. The x-rays of his neck were normal    Assessment:      stable postop    Plan:     I will see him back in one month. He will try MiraLAX. If he remains constipated or has continued pain, I will repeat his CAT scan. He will come back sooner if he develops worsening pain

## 2012-05-11 ENCOUNTER — Encounter (INDEPENDENT_AMBULATORY_CARE_PROVIDER_SITE_OTHER): Payer: Self-pay | Admitting: Surgery

## 2012-05-11 ENCOUNTER — Ambulatory Visit (INDEPENDENT_AMBULATORY_CARE_PROVIDER_SITE_OTHER): Payer: 59 | Admitting: Surgery

## 2012-05-11 ENCOUNTER — Other Ambulatory Visit (INDEPENDENT_AMBULATORY_CARE_PROVIDER_SITE_OTHER): Payer: Self-pay | Admitting: Surgery

## 2012-05-11 VITALS — BP 150/98 | HR 60 | Temp 98.4°F | Resp 18 | Ht 75.0 in | Wt 301.8 lb

## 2012-05-11 DIAGNOSIS — K469 Unspecified abdominal hernia without obstruction or gangrene: Secondary | ICD-10-CM

## 2012-05-11 DIAGNOSIS — R1031 Right lower quadrant pain: Secondary | ICD-10-CM

## 2012-05-11 DIAGNOSIS — R52 Pain, unspecified: Secondary | ICD-10-CM

## 2012-05-11 DIAGNOSIS — G8929 Other chronic pain: Secondary | ICD-10-CM

## 2012-05-11 NOTE — Progress Notes (Signed)
Subjective:     Patient ID: Edward Arroyo, male   DOB: September 06, 1960, 51 y.o.   MRN: [id number]  HPI Edward Arroyo continues to have intermittent pain daily in Edward Arroyo right lower quadrant including a pulling sensation. Edward Arroyo constipation did improve somewhat with the MiraLAX. Edward Arroyo has had no nausea or vomiting. Edward Arroyo controls her pain with oral over-the-counter medications  Review of Systems     Objective:   Physical Exam On exam, Edward Arroyo is well and appearance. Within both lying and standing, I cannot feel a recurrent ventral hernia. Edward Arroyo is mildly tender in the right lower quadrant at Edward Arroyo old scar    Assessment:     Postoperative abdominal pain status post hernia repair    Plan:     Given the size of Edward Arroyo previous hernia and the location, and given Edward Arroyo current symptoms, I am going to get a CAT scan of Edward Arroyo abdomen and pelvis to evaluate for possible recurrence. I will see him back after the CAT scan is completed.

## 2012-05-13 ENCOUNTER — Ambulatory Visit
Admission: RE | Admit: 2012-05-13 | Discharge: 2012-05-13 | Disposition: A | Discharge status: Home / Self Care | Payer: PRIVATE HEALTH INSURANCE | Source: Ambulatory Visit | Attending: Surgery | Admitting: Surgery

## 2012-05-13 DIAGNOSIS — K469 Unspecified abdominal hernia without obstruction or gangrene: Secondary | ICD-10-CM

## 2012-05-13 DIAGNOSIS — R52 Pain, unspecified: Secondary | ICD-10-CM

## 2012-05-13 MED ORDER — IOHEXOL 300 MG/ML  SOLN
125.0000 mL | Freq: Once | INTRAMUSCULAR | Status: AC | PRN
  Administered 2012-05-13: 125 mL via INTRAVENOUS

## 2012-05-18 ENCOUNTER — Ambulatory Visit (INDEPENDENT_AMBULATORY_CARE_PROVIDER_SITE_OTHER): Payer: 59 | Admitting: Surgery

## 2012-05-18 ENCOUNTER — Encounter (INDEPENDENT_AMBULATORY_CARE_PROVIDER_SITE_OTHER): Payer: Self-pay | Admitting: Surgery

## 2012-05-18 VITALS — BP 150/84 | HR 64 | Temp 97.4°F | Resp 16 | Ht 75.0 in | Wt 296.6 lb

## 2012-05-18 DIAGNOSIS — K5900 Constipation, unspecified: Secondary | ICD-10-CM

## 2012-05-18 NOTE — Progress Notes (Signed)
Subjective:     Patient ID: Edward Arroyo, male   DOB: May 11, 1961, 51 y.o.   MRN: [id number]  HPI He is still having constipation and very mild abdominal discomfort.  Review of Systems     Objective:   Physical Exam On exam, I cannot feel a ventral hernia. The CAT scan showed a laxity in the mesh at the ventral hernia no bowel obstruction    Assessment:     Abdominal pain constipation    Plan:     He will continue his current bowel regimen. I will see him back in January. If his symptoms persist, he may need an open hernia repair. I would even have to consider preoperative colonoscopy. He did have a normal colonoscopy approximately 2 years ago

## 2012-07-22 ENCOUNTER — Encounter (INDEPENDENT_AMBULATORY_CARE_PROVIDER_SITE_OTHER): Payer: Self-pay | Admitting: General Surgery

## 2012-07-22 ENCOUNTER — Encounter (INDEPENDENT_AMBULATORY_CARE_PROVIDER_SITE_OTHER): Payer: Self-pay | Admitting: Surgery

## 2012-07-22 ENCOUNTER — Ambulatory Visit (INDEPENDENT_AMBULATORY_CARE_PROVIDER_SITE_OTHER): Payer: 59 | Admitting: Surgery

## 2012-07-22 VITALS — BP 152/90 | HR 60 | Temp 97.3°F | Resp 16 | Ht 75.0 in | Wt 299.6 lb

## 2012-07-22 DIAGNOSIS — R109 Unspecified abdominal pain: Secondary | ICD-10-CM
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
## 2012-11-20 ENCOUNTER — Encounter (INDEPENDENT_AMBULATORY_CARE_PROVIDER_SITE_OTHER): Payer: Self-pay | Admitting: Surgery

## 2013-01-19 ENCOUNTER — Encounter (INDEPENDENT_AMBULATORY_CARE_PROVIDER_SITE_OTHER): Payer: Self-pay | Admitting: Surgery

## 2013-01-19 ENCOUNTER — Ambulatory Visit (INDEPENDENT_AMBULATORY_CARE_PROVIDER_SITE_OTHER): Payer: 59 | Admitting: Surgery

## 2013-01-19 VITALS — BP 140/82 | HR 60 | Temp 98.0°F | Resp 18 | Ht 75.0 in | Wt 309.0 lb

## 2013-01-19 DIAGNOSIS — R109 Unspecified abdominal pain: Secondary | ICD-10-CM

## 2013-01-19 NOTE — Progress Notes (Signed)
Subjective:     Patient ID: Edward Arroyo, male   DOB: [birth year], 52 y.o.   MRN: [id number]  HPI He is here for long-term followup. Again, he is a gentleman who had an open appendectomy performed elsewhere. He presented here with a severe wound infection which had to be operated on and washed out. He later developed a incisional hernia which was repaired Laparoscopic. He still has pain in the right lower quadrant and flank.  He cannot quite tell there is a bulge that he is a large man. He has no nausea or vomiting. It is mostly a pulling, sharp pain.  Review of Systems     Objective:   Physical Exam On exam, he is obese. Within both lying and standing, there is a firmness above the old incision and a laxity below the incision along the inguinal ligament and anterior pelvis. This area is tender    Assessment:     Right flank pain with possible recurrent hernia     Plan:     I am going to get a CAT scan of his abdomen and pelvis to better assess this area. If there is a recurrent hernia, I will probably need to repair this with open surgery. I will see him back after the scan is complete

## 2013-01-21 ENCOUNTER — Ambulatory Visit
Admission: RE | Admit: 2013-01-21 | Discharge: 2013-01-21 | Disposition: A | Discharge status: Home / Self Care | Payer: PRIVATE HEALTH INSURANCE | Source: Ambulatory Visit | Attending: Surgery | Admitting: Surgery

## 2013-01-21 DIAGNOSIS — R109 Unspecified abdominal pain: Secondary | ICD-10-CM

## 2013-01-21 MED ORDER — IOHEXOL 300 MG/ML  SOLN
125.0000 mL | Freq: Once | INTRAMUSCULAR | Status: AC | PRN
  Administered 2013-01-21: 125 mL via INTRAVENOUS

## 2013-02-24 ENCOUNTER — Other Ambulatory Visit (INDEPENDENT_AMBULATORY_CARE_PROVIDER_SITE_OTHER): Payer: Self-pay | Admitting: Surgery

## 2013-02-24 ENCOUNTER — Telehealth (INDEPENDENT_AMBULATORY_CARE_PROVIDER_SITE_OTHER): Payer: Self-pay | Admitting: General Surgery

## 2013-02-24 DIAGNOSIS — K5900 Constipation, unspecified: Secondary | ICD-10-CM

## 2013-02-24 NOTE — Telephone Encounter (Signed)
Called patient and told him what Dr Magnus Ivan wanted to have done, to do the mag citrate and referred to a GI doctor. He stated that he saw Dr Elnoria Howard about two years ago for an colonoscopy. I called Dr Elnoria Howard office and the patient has an apt on 03-01-2013 to see Dr Elnoria Howard for a consolation for a colonoscopy and he is okay with that.

## 2013-02-24 NOTE — Telephone Encounter (Signed)
He needs referral to GI for screening colonoscopy, constipation.  He can also try mag citrate

## 2013-02-24 NOTE — Telephone Encounter (Signed)
Pt called to follow-up his CT and call from Dr. Magnus Ivan.  He reports the CT identified constipation; he has been taking 1 capful of Miralax daily.  Pt states he is having small, loose stools and Dr. Magnus Ivan had mentioned possible referral for colonoscopy.  Upon more questioning, the pt has never relieved the initial constipation and appears to be leaking around a possible impaction.  Recommended Fleets enema and/or glycerine suppository.  Instructed pt to hold the enema as long as he can while lying on his side.  Will notify Dr. Magnus Ivan of all and await his advice as well.  Pattricia Boss aware and will follow up with pt his afternoon.

## 2015-05-31 ENCOUNTER — Other Ambulatory Visit: Payer: Self-pay | Admitting: Gastroenterology

## 2015-05-31 DIAGNOSIS — R1030 Lower abdominal pain, unspecified: Secondary | ICD-10-CM

## 2015-06-01 ENCOUNTER — Ambulatory Visit
Admission: RE | Admit: 2015-06-01 | Discharge: 2015-06-01 | Disposition: A | Discharge status: Home / Self Care | Payer: 59 | Source: Ambulatory Visit | Attending: Gastroenterology | Admitting: Gastroenterology

## 2015-06-01 DIAGNOSIS — R1030 Lower abdominal pain, unspecified: Secondary | ICD-10-CM

## 2015-06-01 MED ORDER — IOPAMIDOL (ISOVUE-300) INJECTION 61%
100.0000 mL | Freq: Once | INTRAVENOUS | Status: AC | PRN
  Administered 2015-06-01: 100 mL via INTRAVENOUS

## 2015-06-09 ENCOUNTER — Other Ambulatory Visit: Payer: PRIVATE HEALTH INSURANCE

## 2016-04-22 ENCOUNTER — Other Ambulatory Visit: Payer: Self-pay | Admitting: Surgery

## 2016-04-24 ENCOUNTER — Other Ambulatory Visit: Payer: Self-pay | Admitting: Surgery

## 2016-04-24 DIAGNOSIS — R1031 Right lower quadrant pain: Secondary | ICD-10-CM

## 2016-04-30 ENCOUNTER — Ambulatory Visit
Admission: RE | Admit: 2016-04-30 | Discharge: 2016-04-30 | Disposition: A | Discharge status: Home / Self Care | Payer: 59 | Source: Ambulatory Visit | Attending: Surgery | Admitting: Surgery

## 2016-04-30 DIAGNOSIS — R1031 Right lower quadrant pain: Secondary | ICD-10-CM

## 2016-04-30 MED ORDER — IOPAMIDOL (ISOVUE-300) INJECTION 61%
100.0000 mL | Freq: Once | INTRAVENOUS | Status: AC | PRN
  Administered 2016-04-30: 100 mL via INTRAVENOUS

## 2017-05-08 ENCOUNTER — Other Ambulatory Visit: Payer: Self-pay | Admitting: Surgery

## 2017-05-08 DIAGNOSIS — R1031 Right lower quadrant pain: Secondary | ICD-10-CM

## 2017-05-21 ENCOUNTER — Ambulatory Visit
Admission: RE | Admit: 2017-05-21 | Discharge: 2017-05-21 | Disposition: A | Discharge status: Home / Self Care | Payer: 59 | Source: Ambulatory Visit | Attending: Surgery | Admitting: Surgery

## 2017-05-21 DIAGNOSIS — R1031 Right lower quadrant pain: Secondary | ICD-10-CM

## 2017-05-21 MED ORDER — IOPAMIDOL (ISOVUE-300) INJECTION 61%
125.0000 mL | Freq: Once | INTRAVENOUS | Status: AC | PRN
  Administered 2017-05-21: 125 mL via INTRAVENOUS

## 2017-12-31 ENCOUNTER — Other Ambulatory Visit: Payer: Self-pay | Admitting: Physician Assistant

## 2017-12-31 DIAGNOSIS — G8929 Other chronic pain: Secondary | ICD-10-CM

## 2017-12-31 DIAGNOSIS — M546 Pain in thoracic spine: Principal | ICD-10-CM

## 2018-03-05 ENCOUNTER — Other Ambulatory Visit: Payer: Self-pay | Admitting: Urology

## 2018-03-05 DIAGNOSIS — C61 Malignant neoplasm of prostate: Secondary | ICD-10-CM

## 2018-03-10 ENCOUNTER — Other Ambulatory Visit: Payer: Self-pay | Admitting: Urology

## 2018-03-10 ENCOUNTER — Encounter (HOSPITAL_COMMUNITY): Payer: Self-pay | Admitting: *Deleted

## 2018-03-11 ENCOUNTER — Encounter (HOSPITAL_COMMUNITY): Payer: Self-pay

## 2018-03-11 NOTE — Patient Instructions (Addendum)
Your procedure is scheduled on: Monday, Oct. 7, 2019   Surgery Time:  12N-3:30P   Report to [facility]  Entr[facility]    Report to admitting at 10:00 AM   Call this number if you have problems the morning of surgery [phone]   Do not eat food or drink liquids :After Midnight.   Brush your teeth the morning of surgery.   Do NOT smoke after Midnight   Take these medicines the morning of surgery with A SIP OF WATER: None                               You may not have any metal on your body including jewelry, and body piercings             Do not wear lotions, powders, perfumes/cologne, or deodorant                          Men may shave face and neck.   Do not bring valuables to the hospital. [place].   Contacts, dentures or bridgework may not be worn into surgery.   Leave suitcase in the car. After surgery it may be brought to your room.    Special Instructions: Bring a copy of your healthcare power of attorney and living will documents         the day of surgery if you haven't scanned them in before.              Please read over the following fact sheets you were given:  [facility] - Preparing for Surgery Before surgery, you can play an important role.  Because skin is not sterile, your skin needs to be as free of germs as possible.  You can reduce the number of germs on your skin by washing with CHG (chlorahexidine gluconate) soap before surgery.  CHG is an antiseptic cleaner which kills germs and bonds with the skin to continue killing germs even after washing. Please DO NOT use if you have an allergy to CHG or antibacterial soaps.  If your skin becomes reddened/irritated stop using the CHG and inform your nurse when you arrive at Short Stay. Do not shave (including legs and underarms) for at least 48 hours prior to the first CHG shower.  You may shave your face/neck.  Please follow these instructions  carefully:  1.  Shower with CHG Soap the night before surgery and the  morning of surgery.  2.  If you choose to wash your hair, wash your hair first as usual with your normal  shampoo.  3.  After you shampoo, rinse your hair and body thoroughly to remove the shampoo.                             4.  Use CHG as you would any other liquid soap.  You can apply chg directly to the skin and wash.  Gently with a scrungie or clean washcloth.  5.  Apply the CHG Soap to your body ONLY FROM THE NECK DOWN.   Do   not use on face/ open  Wound or open sores. Avoid contact with eyes, ears mouth and   genitals (private parts).                       Wash face,  Genitals (private parts) with your normal soap.             6.  Wash thoroughly, paying special attention to the area where your    surgery  will be performed.  7.  Thoroughly rinse your body with warm water from the neck down.  8.  DO NOT shower/wash with your normal soap after using and rinsing off the CHG Soap.                9.  Pat yourself dry with a clean towel.            10.  Wear clean pajamas.            11.  Place clean sheets on your bed the night of your first shower and do not  sleep with pets. Day of Surgery : Do not apply any lotions/deodorants the morning of surgery.  Please wear clean clothes to the hospital/surgery center.  FAILURE TO FOLLOW THESE INSTRUCTIONS MAY RESULT IN THE CANCELLATION OF YOUR SURGERY  PATIENT SIGNATURE_________________________________  NURSE SIGNATURE__________________________________  ________________________________________________________________________

## 2018-03-12 ENCOUNTER — Other Ambulatory Visit: Payer: Self-pay

## 2018-03-12 ENCOUNTER — Encounter (HOSPITAL_COMMUNITY): Payer: Self-pay

## 2018-03-12 ENCOUNTER — Encounter (HOSPITAL_COMMUNITY)
Admission: RE | Admit: 2018-03-12 | Discharge: 2018-03-12 | Disposition: A | Discharge status: Home / Self Care | Payer: 59 | Source: Ambulatory Visit | Attending: Urology | Admitting: Urology

## 2018-03-12 DIAGNOSIS — Z01818 Encounter for other preprocedural examination: Secondary | ICD-10-CM | POA: Diagnosis not present

## 2018-03-12 HISTORY — DX: Ganglion, unspecified site: M67.40

## 2018-03-12 HISTORY — DX: Other chronic pain: G89.29

## 2018-03-12 HISTORY — DX: Diverticulosis of intestine, part unspecified, without perforation or abscess without bleeding: K57.90

## 2018-03-12 HISTORY — DX: Obesity, unspecified: E66.9

## 2018-03-12 HISTORY — DX: Irritable bowel syndrome, unspecified: K58.9

## 2018-03-12 HISTORY — DX: Dorsalgia, unspecified: M54.9

## 2018-03-12 HISTORY — DX: Fatty (change of) liver, not elsewhere classified: K76.0

## 2018-03-12 HISTORY — DX: Right lower quadrant pain: R10.31

## 2018-03-12 HISTORY — DX: Solitary pulmonary nodule: R91.1

## 2018-03-12 HISTORY — DX: Malignant neoplasm of prostate: C61

## 2018-03-12 HISTORY — DX: Pneumonia, unspecified organism: J18.9

## 2018-03-12 LAB — BASIC METABOLIC PANEL
ANION GAP: 9 (ref 5–15)
BUN: 22 mg/dL — ABNORMAL HIGH (ref 6–20)
CHLORIDE: 105 mmol/L (ref 98–111)
CO2: 27 mmol/L (ref 22–32)
Calcium: 9.9 mg/dL (ref 8.9–10.3)
Creatinine, Ser: 0.79 mg/dL (ref 0.61–1.24)
GFR calc non Af Amer: >60 mL/min (ref >60)
Glucose, Bld: 105 mg/dL — ABNORMAL HIGH (ref 70–99)
POTASSIUM: 4.1 mmol/L (ref 3.5–5.1)
Sodium: 141 mmol/L (ref 135–145)

## 2018-03-12 LAB — CBC
HEMATOCRIT: 42.1 % (ref 39.0–52.0)
HEMOGLOBIN: 14 g/dL (ref 13.0–17.0)
MCH: 29.3 pg (ref 26.0–34.0)
MCHC: 33.3 g/dL (ref 30.0–36.0)
MCV: 88.1 fL (ref 78.0–100.0)
Platelets: 233 10*3/uL (ref 150–400)
RBC: 4.78 MIL/uL (ref 4.22–5.81)
RDW: 12.8 % (ref 11.5–15.5)
WBC: 8.1 10*3/uL (ref 4.0–10.5)

## 2018-03-12 LAB — PROTIME-INR
INR: 0.9
Prothrombin Time: 12 seconds (ref 11.4–15.2)

## 2018-03-12 NOTE — Pre-Procedure Instructions (Signed)
BMP results 03/12/2018 faxed to Dr. Gloriann Loan via epic.

## 2018-03-12 NOTE — Pre-Procedure Instructions (Signed)
Final EKG pending, left chart with Rise Paganini, RN to follow up.

## 2018-03-13 ENCOUNTER — Encounter (HOSPITAL_COMMUNITY): Payer: 59

## 2018-03-13 ENCOUNTER — Encounter (HOSPITAL_COMMUNITY): Payer: Self-pay

## 2018-03-18 ENCOUNTER — Other Ambulatory Visit: Payer: Self-pay

## 2018-03-18 ENCOUNTER — Ambulatory Visit: Payer: 59 | Attending: Urology | Admitting: Physical Therapy

## 2018-03-18 ENCOUNTER — Encounter: Payer: Self-pay | Admitting: Physical Therapy

## 2018-03-18 DIAGNOSIS — M6281 Muscle weakness (generalized): Secondary | ICD-10-CM | POA: Insufficient documentation

## 2018-03-18 DIAGNOSIS — R278 Other lack of coordination: Secondary | ICD-10-CM | POA: Diagnosis present

## 2018-03-18 NOTE — Therapy (Signed)
[facility] [address] [place], [place], [address] Phone: [phone]   Fax:  [phone]  Physical Therapy Evaluation  Patient Details  Name: Edward Arroyo MRN: [id number] Date of Birth: 1961/03/03 Referring Provider: Dr. Link Snuffer   Encounter Date: 03/18/2018  PT End of Session - 03/18/18 1412    Visit Number  1    Date for PT Re-Evaluation  06/10/18    Authorization Type  cigna    PT Start Time  1015    PT Stop Time  1100    PT Time Calculation (min)  45 min    Activity Tolerance  Patient tolerated treatment well    Behavior During Therapy  [facility] for tasks assessed/performed       Past Medical History:  Diagnosis Date  . Arthritis    PT STATES A LOT OF JOINT PAIN-SUSPECTS ARTHRITIS  . Chronic back pain   . Constipation    PT HOPING CONSTIPATION IS RELATED TO HERNIA - ON DAILY LAXATIVE, resolved  . Diabetes mellitus    "BORDERLINE"  PT NOT ON ANY MEDICATIONS  . Diverticulosis   . Family history of anesthesia complication    PT'S GRANDSON -POSSIBLE MALIGNANT HYPERTHERMIA--GRANDSON HAVING MUSCLE BIOPSY TODAY  01/10/12   . Fatty liver   . Ganglion cyst    left ankle  . GERD (gastroesophageal reflux disease)    RECENT PROBLEMS-NO MEDS, no longer an issue  . Groin pain, right   . Heart murmur    AS A CHILD  . Hemorrhoids   . Hypertension   . IBS (irritable bowel syndrome)    resolved  . Incisional hernia    S/P APPENDECTOMY  . Lung nodule    51mm Left Lower Lobe  . Malignant hyperthermia    SEE FAMILY HX OF GRANDSON POSSIBLE MH. PT HAD APPENDECTOMY SEPT 2011 AT [place] OF GRANDSON'S HISTORY OF "POSSIBLE" MALIGNANT HYPERTHERMIA.  PT STATES HE WAS TOLD THE SPINAL WAS HARD TO  PLACE-THAT HE HAD CALCIFICATIONS.  PT STATES HIS THIGH'S HAVE HAD SOME NUMBNESS SINCE THE SPINAL.MH has since been ruled out. patient has a hard  . Obese   .  Pneumonia   . Prostate cancer ([place])   . Sleep apnea    STOP BANG SCORE OF 5    Past Surgical History:  Procedure Laterality Date  . APPENDECTOMY  02-26-2010  . COLONOSCOPY    . INCISIONAL HERNIA REPAIR  01/15/2012   Procedure: LAPAROSCOPIC INCISIONAL HERNIA;  Surgeon: Harl Bowie, MD;  Location: [facility];  Service: General;  Laterality: N/A;  Laparoscopic Incisional Hernia Repair with Mesh    There were no vitals filed for this visit.   Subjective Assessment - 03/18/18 1020    Subjective  Patient will have prostectomy on 03/30/2018 robotically. Patient was diagnosed 3 weeks ago. Pain in the rigth groin that is worse in the last 2-3 weeks.     Patient is accompained by:  Family member   wife   Patient Stated Goals  education on prostectomy    Currently in Pain?  Yes    Pain Score  2     Pain Location  Groin    Pain Orientation  Right    Pain Descriptors / Indicators  Aching    Pain Type  Acute pain    Pain Onset  1 to 4 weeks ago    Pain Frequency  Intermittent    Aggravating  Factors   eat, lifting    Pain Relieving Factors  Nothing    Multiple Pain Sites  No         [facility] PT Assessment - 03/18/18 0001      Assessment   Medical Diagnosis  C61 Prostate Cancer    Referring Provider  Dr. Link Snuffer    Onset Date/Surgical Date  02/25/18    Prior Therapy  none      Precautions   Precautions  Other (comment)    Precaution Comments  prostate cancer      Restrictions   Weight Bearing Restrictions  No      Balance Screen   Has the patient fallen in the past 6 months  No    Has the patient had a decrease in activity level because of a fear of falling?   No    Is the patient reluctant to leave their home because of a fear of falling?   No      Home Film/video editor residence      Prior Function   Level of Independence  Independent    Vocation  Full time employment    Vocation Requirements  lifting, office job, standing,     Leisure  2  months ago was riding a bike      Charity fundraiser Status  Within Functional Limits for tasks assessed      Posture/Postural Control   Posture/Postural Control  Postural limitations    Postural Limitations  Rounded Shoulders;Forward head;Flexed trunk;Decreased lumbar lordosis      ROM / Strength   AROM / PROM / Strength  AROM;PROM;Strength      AROM   Lumbar Extension  decreased by 50%    Lumbar - Right Side Bend  decreased by 25%    Lumbar - Left Side Bend  decreased by 25%                Objective measurements completed on examination: See above findings.    Pelvic Floor Special Questions - 03/18/18 0001    Skin Integrity  Intact    Biofeedback  sitting resting tone 1.75uv; 35-40 uv 10 second contraction; contract quickly sitting at 40 uv. Bridge with pelvic floor contraction; pelvic floor contraction standing wiht 10 second and quick contraction    Biofeedback sensor type  Rectal   rectal    Biofeedback Activity  Quick contraction;10 second hold               PT Education - 03/18/18 1533    Education Details  pelvic floor contraction in sitting and standing; education on stretches; education on toileting correctly; education on drinking water and bladder irritants; education on posture    Person(s) Educated  Patient;Spouse    Methods  Explanation;Demonstration;Verbal cues;Handout    Comprehension  Returned demonstration;Verbalized understanding     Quick Contraction: Gravity Resisted (Sitting)    Sitting, quickly squeeze then fully relax pelvic floor. Perform __1_ sets of _5__. Rest for _1__ seconds between sets. Do _5__ times a day.  Copyright  VHI. All rights reserved.  Quick Contraction: Gravity Resisted (Sitting)    Sitting, quickly squeeze then fully relax pelvic floor. Perform ___ sets of ___. Rest for ___ seconds between sets. Do ___ times a day.  Copyright  VHI. All rights reserved.  Slow Contraction: Gravity Resisted  (Sitting)    Sitting, slowly squeeze pelvic floor for _10__ seconds. Rest for __5_ seconds. Repeat _5__  times. Do __5_ times a day.  Copyright  VHI. All rights reserved.  Bracing With Bridging (Hook-Lying)    With neutral spine, tighten pelvic floor and abdominals and hold. Lift bottom. Repeat _10__ times. Do _1__ times a day.   Copyright  VHI. All rights reserved.  Pre-Prostectomy Physical Therapy Program  Water Intake: You should be drinking 8 glasses of water to decrease the irritants in the bladder and to promote healing. Reduce the number of bladder irritants 0-1 glasses per day. Stop drinking caffeine, sodas , alcohol.  Incontinence Products: Briefs-underwear that have a padded area in the front Penile Clamp- clamp you place on the penis that is cushioned to stop the flow of urine Incontinence pads that is disposable Men's acticuf- pouch that goes around penis to manage light to moderate incontinence. Strengthening Program: 1. Gluteal squeeze-lie on your back or sit in a chair. Squeeze your buttocks for 5 seconds 5 reps. every hour. 2. Transverse Abdominus Contraction - lay on your back, push your belly button down to the mat, hands in front and curl upward to your shoulder blade 5x 3x per day. 3. Pelvic floor contraction 4. Piriformis stretch- supine and hip rotation 5. Sitting hamstring stretch  After surgery 1.  Catheter- after surgery a catheter will stay in for 7-10 days. After the catheter is removed you are able to resume the gluteal contraction, abdominal contractions and pelvic floor contractions 2. Bed positioning- pillows to support right hip and knee in sidely. 3. Caution: 1. Blood in urine 2. Increased pain in right leg at night  Gluteal Squeeze    Squeeze buttocks muscles as tightly as possible while counting out loud to _5___. Repeat _5___ times. Or in sitting. 5 times per day  http://gt2.exer.us/363   Copyright  VHI. All rights reserved.       Isometric Hold (Hook-Lying)    Lie with hips and knees bent. Slowly inhale, and then exhale. Pull navel toward spine and Hold for __5_ seconds. Continue to breathe in and out during hold. Rest for __5_ seconds. Repeat _5__ times. Do _5__ times a day. And can do in sitting.    Copyright  VHI. All rights reserved.  Quick Contraction: Gravity Resisted (Standing)    Standing, quickly squeeze then fully relax pelvic floor. Perform __1_ sets of _5__. Rest for _1__ seconds between sets. Do _5__ times a day.  Copyright  VHI. All rights reserved.  Slow Contraction: Gravity Resisted (Standing)    Standing, slowly squeeze pelvic floor for _10__ seconds. Rest for _10__ seconds. Repeat _5__ times. Do __5_ times a day.  Copyright  VHI. All rights reserved.  Toileting Techniques for Bowel Movements (Defecation) Using your belly (abdomen) and pelvic floor muscles to have a bowel movement is usually instinctive.  Sometimes people can have problems with these muscles and have to relearn proper defecation (emptying) techniques.  If you have weakness in your muscles, organs that are falling out, decreased sensation in your pelvis, or ignore your urge to go, you may find yourself straining to have a bowel movement.  You are straining if you are: . holding your breath or taking in a huge gulp of air and holding it  . keeping your lips and jaw tensed and closed tightly . turning red in the face because of excessive pushing or forcing . developing or worsening your  hemorrhoids . getting faint while pushing . not emptying completely and have to defecate many times a day  If you are straining, you are  actually making it harder for yourself to have a bowel movement.  Many people find they are pulling up with the pelvic floor muscles and closing off instead of opening the anus. Due to lack pelvic floor relaxation and coordination the abdominal muscles, one has to work harder to push the feces  out.  Many people have never been taught how to defecate efficiently and effectively.  Notice what happens to your body when you are having a bowel movement.  While you are sitting on the toilet pay attention to the following areas: . Jaw and mouth position . Angle of your hips   . Whether your feet touch the ground or not . Arm placement  . Spine position . Waist . Belly tension . Anus (opening of the anal canal)  An Evacuation/Defecation Plan   Here are the 4 basic points:  1. Lean forward enough for your elbows to rest on your knees 2. Support your feet on the floor or use a low stool if your feet don't touch the floor  3. Push out your belly as if you have swallowed a beach ball-you should feel a widening of your waist 4. Open and relax your pelvic floor muscles, rather than tightening around the anus      The following conditions my require modifications to your toileting posture:  . If you have had surgery in the past that limits your back, hip, pelvic, knee or ankle flexibility . Constipation   Your healthcare practitioner may make the following additional suggestions and adjustments:  1) Sit on the toilet  a) Make sure your feet are supported. b) Notice your hip angle and spine position-most people find it effective to lean forward or raise their knees, which can help the muscles around the anus to relax  c) When you lean forward, place your forearms on your thighs for support  2) Relax suggestions a) Breath deeply in through your nose and out slowly through your mouth as if you are smelling the flowers and blowing out the candles. b) To become aware of how to relax your muscles, contracting and releasing muscles can be helpful.  Pull your pelvic floor muscles in tightly by using the image of holding back gas, or closing around the anus (visualize making a circle smaller) and lifting the anus up and in.  Then release the muscles and your anus should drop down and feel  open. Repeat 5 times ending with the feeling of relaxation. c) Keep your pelvic floor muscles relaxed; let your belly bulge out. d) The digestive tract starts at the mouth and ends at the anal opening, so be sure to relax both ends of the tube.  Place your tongue on the roof of your mouth with your teeth separated.  This helps relax your mouth and will help to relax the anus at the same time.  3) Empty (defecation) a) Keep your pelvic floor and sphincter relaxed, then bulge your anal muscles.  Make the anal opening wide.  b) Stick your belly out as if you have swallowed a beach ball. c) Make your belly wall hard using your belly muscles while continuing to breathe. Doing this makes it easier to open your anus. d) Breath out and give a grunt (or try using other sounds such as ahhhh, shhhhh, ohhhh or grrrrrrr).  4) Finish a) As you finish your bowel movement, pull the pelvic floor muscles up and in.  This will leave your anus in the proper place rather than  remaining pushed out and down. If you leave your anus pushed out and down, it will start to feel as though that is normal and give you incorrect signals about needing to have a bowel movement.    Earlie Counts, PT Evamaria [email or web address] [place] [place], [place] [address]    PT Short Term Goals - 03/18/18 1544      PT SHORT TERM GOAL #1   Title  understand how to contract the pelvic floor during different positions    Time  4    Period  Weeks    Status  New    Target Date  04/15/18      PT SHORT TERM GOAL #2   Title  understand how to perform daily tasks and transitional movements without straining the pelvic floor    Time  4    Period  Weeks    Status  New    Target Date  04/15/18      PT SHORT TERM GOAL #3   Title  how to toilet correctly to not strain the pelvic floor after surgery    Time  4    Period  Weeks    Status  New    Target Date  04/15/18      PT SHORT TERM  GOAL #4   Title  understand the importance of walking program for recovery after surgery    Time  4    Period  Weeks    Status  New    Target Date  04/15/18      PT SHORT TERM GOAL #5   Title  understand upright posture and how it assists in pelvic floor contraction    Time  4    Period  Weeks    Status  New    Target Date  04/15/18        PT Long Term Goals - 03/18/18 1546      PT LONG TERM GOAL #1   Title  after surgery, Patient is reassessed for new goals    Time  12    Period  Weeks    Status  New    Target Date  06/10/18             Plan - 03/18/18 1534    Clinical Impression Statement  Patietn is a 56 year old male with diagnosis of prostate cancer.  Patient will be having a prostectomy on 03/30/2018.  Patient reports 2/10 pain level in right groin with eating and lifting.  Patient posture consists with flexed posture, forward head, rounded shoulders and flat lumbar. Patient able to relax pelvic floor at 2 uv.  Patient is able to contract pelvic floor to 35 uv for 10 seconds and 40 uv for quick flick in sitting.  Patient is able to contract to 30 uv for 10 seconds in standing.  Patient was able to brace his abdominals after tactile cues to not bulge the muscle.  Patient is coming to therapy to be educated on exercises to perform to strengthen the pelvic floor muscles to reduce symptoms of urinary leakage after surgery. Patient is learning about bladder irritants, posture to decrease strain on the pelvic floor, and precautions with catheter. Patient will be reassessed after surgery for further goals and strength of pelvic floor.     History and Personal Factors relevant to plan of care:  Hernia repair 01/14/2018; will be having Prostectomy 03/30/2018    Clinical Presentation  Stable  Clinical Presentation due to:  stable now but after surgery this may change    Clinical Decision Making  Low    Rehab Potential  Excellent    Clinical Impairments Affecting Rehab Potential   Hernia repair 01/14/2018; will be having Prostectomy 03/30/2018    PT Frequency  1x / week    PT Duration  12 weeks    PT Treatment/Interventions  Biofeedback;Cryotherapy;Electrical Stimulation;Moist Heat;Therapeutic exercise;Therapeutic activities;Neuromuscular re-education;Patient/family education;Manual techniques    PT Next Visit Plan  transitional movement for after surgery, hip flexor stretch, correct body mechanics for daily techniques; abdominal massage,     Consulted and Agree with Plan of Care  Patient;Family member/caregiver    Family Member Consulted  wife       Patient will benefit from skilled therapeutic intervention in order to improve the following deficits and impairments:  Decreased strength  Visit Diagnosis: Muscle weakness (generalized) - Plan: PT plan of care cert/re-cert  Other lack of coordination - Plan: PT plan of care cert/re-cert     Problem List Patient Active Problem List   Diagnosis Date Noted  . Hx of appendectomy 11/20/2011  . Incisional hernia 11/20/2011    Earlie Counts, PT 03/18/18 3:50 PM   [facility] [address], [place], [place], [address] Phone: [phone]   Fax:  [phone]  Name: Kasch Borquez MRN: [id number] Date of Birth: 24-Jan-1961

## 2018-03-18 NOTE — Patient Instructions (Addendum)
Quick Contraction: Gravity Resisted (Sitting)    Sitting, quickly squeeze then fully relax pelvic floor. Perform __1_ sets of _5__. Rest for _1__ seconds between sets. Do _5__ times a day.  Copyright  VHI. All rights reserved.  Quick Contraction: Gravity Resisted (Sitting)    Sitting, quickly squeeze then fully relax pelvic floor. Perform ___ sets of ___. Rest for ___ seconds between sets. Do ___ times a day.  Copyright  VHI. All rights reserved.  Slow Contraction: Gravity Resisted (Sitting)    Sitting, slowly squeeze pelvic floor for _10__ seconds. Rest for __5_ seconds. Repeat _5__ times. Do __5_ times a day.  Copyright  VHI. All rights reserved.  Bracing With Bridging (Hook-Lying)    With neutral spine, tighten pelvic floor and abdominals and hold. Lift bottom. Repeat _10__ times. Do _1__ times a day.   Copyright  VHI. All rights reserved.  Pre-Prostectomy Physical Therapy Program  Water Intake: You should be drinking 8 glasses of water to decrease the irritants in the bladder and to promote healing. Reduce the number of bladder irritants 0-1 glasses per day. Stop drinking caffeine, sodas , alcohol.  Incontinence Products: Briefs-underwear that have a padded area in the front Penile Clamp- clamp you place on the penis that is cushioned to stop the flow of urine Incontinence pads that is disposable Men's acticuf- pouch that goes around penis to manage light to moderate incontinence. Strengthening Program: 1. Gluteal squeeze-lie on your back or sit in a chair. Squeeze your buttocks for 5 seconds 5 reps. every hour. 2. Transverse Abdominus Contraction - lay on your back, push your belly button down to the mat, hands in front and curl upward to your shoulder blade 5x 3x per day. 3. Pelvic floor contraction 4. Piriformis stretch- supine and hip rotation 5. Sitting hamstring stretch  After surgery 1.  Catheter- after surgery a catheter will stay in for 7-10 days.  After the catheter is removed you are able to resume the gluteal contraction, abdominal contractions and pelvic floor contractions 2. Bed positioning- pillows to support right hip and knee in sidely. 3. Caution: 1. Blood in urine 2. Increased pain in right leg at night  Gluteal Squeeze    Squeeze buttocks muscles as tightly as possible while counting out loud to _5___. Repeat _5___ times. Or in sitting. 5 times per day  http://gt2.exer.us/363   Copyright  VHI. All rights reserved.      Isometric Hold (Hook-Lying)    Lie with hips and knees bent. Slowly inhale, and then exhale. Pull navel toward spine and Hold for __5_ seconds. Continue to breathe in and out during hold. Rest for __5_ seconds. Repeat _5__ times. Do _5__ times a day. And can do in sitting.    Copyright  VHI. All rights reserved.  Quick Contraction: Gravity Resisted (Standing)    Standing, quickly squeeze then fully relax pelvic floor. Perform __1_ sets of _5__. Rest for _1__ seconds between sets. Do _5__ times a day.  Copyright  VHI. All rights reserved.  Slow Contraction: Gravity Resisted (Standing)    Standing, slowly squeeze pelvic floor for _10__ seconds. Rest for _10__ seconds. Repeat _5__ times. Do __5_ times a day.  Copyright  VHI. All rights reserved.  Toileting Techniques for Bowel Movements (Defecation) Using your belly (abdomen) and pelvic floor muscles to have a bowel movement is usually instinctive.  Sometimes people can have problems with these muscles and have to relearn proper defecation (emptying) techniques.  If you have weakness in your muscles,  organs that are falling out, decreased sensation in your pelvis, or ignore your urge to go, you may find yourself straining to have a bowel movement.  You are straining if you are: . holding your breath or taking in a huge gulp of air and holding it  . keeping your lips and jaw tensed and closed tightly . turning red in the face because of  excessive pushing or forcing . developing or worsening your  hemorrhoids . getting faint while pushing . not emptying completely and have to defecate many times a day  If you are straining, you are actually making it harder for yourself to have a bowel movement.  Many people find they are pulling up with the pelvic floor muscles and closing off instead of opening the anus. Due to lack pelvic floor relaxation and coordination the abdominal muscles, one has to work harder to push the feces out.  Many people have never been taught how to defecate efficiently and effectively.  Notice what happens to your body when you are having a bowel movement.  While you are sitting on the toilet pay attention to the following areas: . Jaw and mouth position . Angle of your hips   . Whether your feet touch the ground or not . Arm placement  . Spine position . Waist . Belly tension . Anus (opening of the anal canal)  An Evacuation/Defecation Plan   Here are the 4 basic points:  1. Lean forward enough for your elbows to rest on your knees 2. Support your feet on the floor or use a low stool if your feet don't touch the floor  3. Push out your belly as if you have swallowed a beach ball-you should feel a widening of your waist 4. Open and relax your pelvic floor muscles, rather than tightening around the anus      The following conditions my require modifications to your toileting posture:  . If you have had surgery in the past that limits your back, hip, pelvic, knee or ankle flexibility . Constipation   Your healthcare practitioner may make the following additional suggestions and adjustments:  1) Sit on the toilet  a) Make sure your feet are supported. b) Notice your hip angle and spine position-most people find it effective to lean forward or raise their knees, which can help the muscles around the anus to relax  c) When you lean forward, place your forearms on your thighs for  support  2) Relax suggestions a) Breath deeply in through your nose and out slowly through your mouth as if you are smelling the flowers and blowing out the candles. b) To become aware of how to relax your muscles, contracting and releasing muscles can be helpful.  Pull your pelvic floor muscles in tightly by using the image of holding back gas, or closing around the anus (visualize making a circle smaller) and lifting the anus up and in.  Then release the muscles and your anus should drop down and feel open. Repeat 5 times ending with the feeling of relaxation. c) Keep your pelvic floor muscles relaxed; let your belly bulge out. d) The digestive tract starts at the mouth and ends at the anal opening, so be sure to relax both ends of the tube.  Place your tongue on the roof of your mouth with your teeth separated.  This helps relax your mouth and will help to relax the anus at the same time.  3) Empty (defecation) a) Keep your  pelvic floor and sphincter relaxed, then bulge your anal muscles.  Make the anal opening wide.  b) Stick your belly out as if you have swallowed a beach ball. c) Make your belly wall hard using your belly muscles while continuing to breathe. Doing this makes it easier to open your anus. d) Breath out and give a grunt (or try using other sounds such as ahhhh, shhhhh, ohhhh or grrrrrrr).  4) Finish a) As you finish your bowel movement, pull the pelvic floor muscles up and in.  This will leave your anus in the proper place rather than remaining pushed out and down. If you leave your anus pushed out and down, it will start to feel as though that is normal and give you incorrect signals about needing to have a bowel movement.    Earlie Counts, PT [email or web address] .[facility] [address] [place], [place] [address]

## 2018-03-24 ENCOUNTER — Ambulatory Visit: Payer: 59 | Attending: Urology | Admitting: Physical Therapy

## 2018-03-24 ENCOUNTER — Encounter: Payer: Self-pay | Admitting: Physical Therapy

## 2018-03-24 DIAGNOSIS — M6281 Muscle weakness (generalized): Secondary | ICD-10-CM | POA: Insufficient documentation

## 2018-03-24 DIAGNOSIS — R278 Other lack of coordination: Secondary | ICD-10-CM | POA: Insufficient documentation

## 2018-03-24 NOTE — Therapy (Signed)
[facility] [address] [place], [place], [address] Phone: [phone]   Fax:  [phone]  Physical Therapy Treatment  Patient Details  Name: Edward Arroyo MRN: [id number] Date of Birth: 05-26-1961 Referring Provider (PT): Dr. Link Snuffer   Encounter Date: 03/24/2018  PT End of Session - 03/24/18 1656    Visit Number  2    Date for PT Re-Evaluation  06/10/18    Authorization Type  cigna    PT Start Time  1615    PT Stop Time  1655    PT Time Calculation (min)  40 min    Activity Tolerance  Patient tolerated treatment well    Behavior During Therapy  [facility] for tasks assessed/performed       Past Medical History:  Diagnosis Date  . Arthritis    PT STATES A LOT OF JOINT PAIN-SUSPECTS ARTHRITIS  . Chronic back pain   . Constipation    PT HOPING CONSTIPATION IS RELATED TO HERNIA - ON DAILY LAXATIVE, resolved  . Diabetes mellitus    "BORDERLINE"  PT NOT ON ANY MEDICATIONS  . Diverticulosis   . Family history of anesthesia complication    PT'S GRANDSON -POSSIBLE MALIGNANT HYPERTHERMIA--GRANDSON HAVING MUSCLE BIOPSY TODAY  01/10/12   . Fatty liver   . Ganglion cyst    left ankle  . GERD (gastroesophageal reflux disease)    RECENT PROBLEMS-NO MEDS, no longer an issue  . Groin pain, right   . Heart murmur    AS A CHILD  . Hemorrhoids   . Hypertension   . IBS (irritable bowel syndrome)    resolved  . Incisional hernia    S/P APPENDECTOMY  . Lung nodule    87mm Left Lower Lobe  . Malignant hyperthermia    SEE FAMILY HX OF GRANDSON POSSIBLE MH. PT HAD APPENDECTOMY SEPT 2011 AT [facility] OF GRANDSON'S HISTORY OF "POSSIBLE" MALIGNANT HYPERTHERMIA.  PT STATES HE WAS TOLD THE SPINAL WAS HARD TO  PLACE-THAT HE HAD CALCIFICATIONS.  PT STATES HIS THIGH'S HAVE HAD SOME NUMBNESS SINCE THE SPINAL.MH has since been ruled out. patient has a hard  . Obese   .  Pneumonia   . Prostate cancer ([place])   . Sleep apnea    STOP BANG SCORE OF 5    Past Surgical History:  Procedure Laterality Date  . APPENDECTOMY  02-26-2010  . COLONOSCOPY    . INCISIONAL HERNIA REPAIR  01/15/2012   Procedure: LAPAROSCOPIC INCISIONAL HERNIA;  Surgeon: Harl Bowie, MD;  Location: [facility];  Service: General;  Laterality: N/A;  Laparoscopic Incisional Hernia Repair with Mesh    There were no vitals filed for this visit.  Subjective Assessment - 03/24/18 1618    Subjective  I have surgery next week on Monday.     Patient is accompained by:  Family member   wife   Patient Stated Goals  education on prostectomy    Currently in Pain?  Yes    Pain Score  2     Pain Location  Groin    Pain Orientation  Right    Pain Descriptors / Indicators  Aching    Pain Type  Acute pain    Pain Onset  1 to 4 weeks ago    Pain Frequency  Intermittent    Aggravating Factors   eat, lifting     Pain Relieving Factors  nothing    Multiple Pain  Sites  No                       [facility] Adult PT Treatment/Exercise - 03/24/18 0001      Self-Care   Self-Care  Other Self-Care Comments    Other Self-Care Comments   scar massage and abdominal massage      Therapeutic Activites    Therapeutic Activities  ADL's;Lifting    ADL's  bed mobility, getting in and out of bed and car, standing, sitting, dressing; bed positioning    Lifting  lifting without holding breath, bending at hips, breathing      Exercises   Exercises  Lumbar      Lumbar Exercises: Stretches   Hip Flexor Stretch  Right;1 rep;30 seconds   with strap     Lumbar Exercises: Supine   Clam  20 reps;1 second   with pelvic floor contraction   Heel Slides  20 reps   with pelvic floor contraction            PT Education - 03/24/18 1651    Education Details  body mechanics with daily tasks, abdominal exercises with pelvic floor contraction, hip flexor stretch, abdominal and scar massage,      Person(s) Educated  Patient    Methods  Explanation;Demonstration;Verbal cues;Handout    Comprehension  Returned demonstration;Verbalized understanding       PT Short Term Goals - 03/24/18 1659      PT SHORT TERM GOAL #1   Title  understand how to contract the pelvic floor during different positions    Time  4    Period  Weeks    Status  Achieved      PT SHORT TERM GOAL #2   Title  understand how to perform daily tasks and transitional movements without straining the pelvic floor    Time  4    Period  Weeks    Status  Achieved      PT SHORT TERM GOAL #3   Title  how to toilet correctly to not strain the pelvic floor after surgery    Time  4    Period  Weeks    Status  Achieved      PT SHORT TERM GOAL #4   Title  understand the importance of walking program for recovery after surgery    Time  4    Period  Weeks    Status  Achieved      PT SHORT TERM GOAL #5   Title  understand upright posture and how it assists in pelvic floor contraction    Time  4    Period  Weeks    Status  Achieved        PT Long Term Goals - 03/18/18 1546      PT LONG TERM GOAL #1   Title  after surgery, Patient is reassessed for new goals    Time  12    Period  Weeks    Status  New    Target Date  06/10/18            Plan - 03/24/18 1620    Clinical Impression Statement  Patient understands how to contract the pelvic floor with ADL's and lifting. Patient understands the importance of abdominal bracing exercises and performing pelvic floor contractions.  Patient understands how to manage urinary leakage after surgery and bed positioning.  Patient reports the right groin pain has been better since he has been doing  the exercises from therapy.  Patient will be having surgery on 03/30/2018.  Patient will return after the surgeon gives him a release for physical therapy.     Rehab Potential  Excellent    Clinical Impairments Affecting Rehab Potential  Hernia repair 01/14/2018; will be having  Prostectomy 03/30/2018    PT Frequency  1x / week    PT Duration  12 weeks    PT Treatment/Interventions  Biofeedback;Cryotherapy;Electrical Stimulation;Moist Heat;Therapeutic exercise;Therapeutic activities;Neuromuscular re-education;Patient/family education;Manual techniques    PT Next Visit Plan  transitional movement for after surgery, hip flexor stretch, correct body mechanics for daily techniques; abdominal massage,     PT Home Exercise Plan  reassess patient after surgery    Recommended Other Services  MD signed the initial order    Consulted and Agree with Plan of Care  Patient;Family member/caregiver    Family Member Consulted  wife       Patient will benefit from skilled therapeutic intervention in order to improve the following deficits and impairments:  Decreased strength  Visit Diagnosis: Muscle weakness (generalized)  Other lack of coordination     Problem List Patient Active Problem List   Diagnosis Date Noted  . Hx of appendectomy 11/20/2011  . Incisional hernia 11/20/2011    Earlie Counts, PT 03/24/18 5:01 PM   [facility] [address] [place], [place], [address] Phone: [phone]   Fax:  [phone]  Name: Damein Gaunce MRN: [id number] Date of Birth: Jan 30, 1961

## 2018-03-24 NOTE — Patient Instructions (Addendum)
Getting Into / Out of Bed    Lower self to lie down on one side by raising legs and lowering head at the same time. Use arms to assist moving without twisting. Bend both knees to roll onto back if desired. To sit up, start from lying on side, and use same move-ments in reverse. Keep trunk aligned with legs.   Copyright  VHI. All rights reserved.  Log Roll    Lying on back, bend left knee and place left arm across chest. Roll all in one movement to the right. Reverse to roll to the left. Always move as one unit.   Copyright  VHI. All rights reserved.  Sleeping on Back    Place pillow under knees. A pillow with cervical support and a roll around waist are also helpful.   Copyright  VHI. All rights reserved.  Sleeping on Side    Place pillow between knees and feet. Use cervical support under neck and a roll around waist as needed.   Copyright  VHI. All rights reserved.   No lifting more than 5#  For 4 weeks after surgery   Lifting Principles  .Maintain proper posture and head alignment. .Slide object as close as possible before lifting. .Move obstacles out of the way. .Test before lifting; ask for help if too heavy. .Tighten stomach muscles without holding breath. .Use smooth movements; do not jerk. .Use legs to do the work, and pivot with feet. .Distribute the work load symmetrically and close to the center of trunk. .Push instead of pull whenever possible.  Copyright  VHI. All rights reserved.  Deep Squat    Squat and lift with both arms held against upper trunk. Tighten stomach muscles without holding breath. Use smooth movements to avoid jerking.  Copyright  VHI. All rights reserved.  Getting Into / Out of Car    Lower self onto seat, scoot back, then bring in one leg at a time. Reverse sequence to get out.   Copyright  VHI. All rights reserved.  Stand to Sit / Sit to Stand    To sit: Bend knees to lower self onto front edge of chair, then  scoot back on seat. To stand: Reverse sequence by placing one foot forward, and scoot to front of seat. Use rocking motion to stand up.  Copyright  VHI. All rights reserved.  Shaving     Stand with one foot on ledge of cabinet under sink.  Copyright  VHI. All rights reserved.  Brushing Teeth     Place one foot on ledge and one hand on counter. Bend other knee slightly to keep back straight.  Copyright  VHI. All rights reserved.  Dressing    Lie on back to pull socks or slacks over feet, or sit and bend leg while keeping back straight.   Copyright  VHI. All rights reserved.  About Abdominal Massage  Abdominal massage, also called external colon massage, is a self-treatment circular massage technique that can reduce and eliminate gas and ease constipation. The colon naturally contracts in waves in a clockwise direction starting from inside the right hip, moving up toward the ribs, across the belly, and down inside the left hip.  When you perform circular abdominal massage, you help stimulate your colon's normal wave pattern of movement called peristalsis.  It is most beneficial when done after eating.  Positioning You can practice abdominal massage with oil while lying down, or in the shower with soap.  Some people find that it is  just as effective to do the massage through clothing while sitting or standing.  How to Massage Start by placing your finger tips or knuckles on your right side, just inside your hip bone.  . Make small circular movements while you move upward toward your rib cage.   . Once you reach the bottom right side of your rib cage, take your circular movements across to the left side of the bottom of your rib cage.  . Next, move downward until you reach the inside of your left hip bone.  This is the path your feces travel in your colon. . Continue to perform your abdominal massage in this pattern for 10 minutes each day.     You can apply as much  pressure as is comfortable in your massage.  Start gently and build pressure as you continue to practice.  Notice any areas of pain as you massage; areas of slight pain may be relieved as you massage, but if you have areas of significant or intense pain, consult with your healthcare provider.  Other Considerations . General physical activity including bending and stretching can have a beneficial massage-like effect on the colon.  Deep breathing can also stimulate the colon because breathing deeply activates the same nervous system that supplies the colon.   . Abdominal massage should always be used in combination with a bowel-conscious diet that is high in the proper type of fiber for you, fluids (primarily water), and a regular exercise program. Scar Massage  Scar massage is done to improve the mobility of scar, decrease scar tissue from building up, reduce adhesions, and prevent Keloids from forming. Start scar massage after scabs have fallen off by themselves and no open areas. The first few weeks after surgery, it is normal for a scar to appear pink or red and slightly raised. Scars can itch or have areas of numbness. Some scars may be sensitive.   Direct Scar massage: after scar is healed, no opening, no scab 1.  Place pads of two fingers together directly on the scar starting at one end of the scar. Move the fingers up and down across the scar holding 5 seconds one direction.  Then go opposite direction hold 5 seconds.  2. Move over to the next section of the scar and repeat.  Work your way along the entire length of the scar.   3. Next make diagonal movements along the scar holding 5 seconds at one direction. 4. Next movement is side to side. 5. Do not rub fingers over the scar.  Instead keep firm pressure and move scar over the tissue it is on top   Scar Lift and Roll 12 weeks after surgery. 1. Pinch a small amount of the scar between your first two fingers and thumb.  2. Roll the scar  between your fingers for 5 to 15 seconds. 3. Move along the scar and repeat until you have massaged the entire length of scar.   Stop the massage and call your doctor if you notice: 1. Increased redness 2. Bleeding from scar 3. Seepage coming from the scar 4. Scar is warmer and has increased pain   Hip Flexor Stretch    Lying on back near edge of bed, bend one leg, foot flat. Hang other leg over edge, relaxed, thigh resting entirely on bed for __30 sec. Repeat __2__ times. Do __1__ sessions per day. Can use belt or sheet around ankle Do not do after surgery until you see therapist.  http://gt2.exer.us/346  Copyright  VHI. All rights reserved.  Bracing With Knee Fallout (Hook-Lying)    With neutral spine, tighten pelvic floor and abdominals and hold. Alternating legs, drop knee out to side. Keep opposite hip still. Repeat _10__ times. Do __1_ times a day.   Copyright  VHI. All rights reserved.  Bracing With Heel Slides (Supine)    With neutral spine, tighten pelvic floor and abdominals and hold. Alternating legs, slide heel to bottom. Repeat _10__ times. Do _1__ times a day.   Copyright  VHI. All rights reserved.  [facility] [address], [place], [place] [address] Phone # [phone] Fax [phone]

## 2018-03-30 ENCOUNTER — Observation Stay (HOSPITAL_COMMUNITY)
Admission: RE | Admit: 2018-03-30 | Discharge: 2018-04-01 | Disposition: A | Discharge status: Home / Self Care | Payer: 59 | Source: Ambulatory Visit | Attending: Urology | Admitting: Urology

## 2018-03-30 ENCOUNTER — Ambulatory Visit (HOSPITAL_COMMUNITY): Payer: 59

## 2018-03-30 ENCOUNTER — Encounter (HOSPITAL_COMMUNITY): Payer: Self-pay | Admitting: Anesthesiology

## 2018-03-30 ENCOUNTER — Encounter (HOSPITAL_COMMUNITY)
Admission: RE | Disposition: A | Discharge status: Home / Self Care | Payer: Self-pay | Source: Ambulatory Visit | Attending: Urology

## 2018-03-30 ENCOUNTER — Other Ambulatory Visit: Payer: Self-pay

## 2018-03-30 DIAGNOSIS — N529 Male erectile dysfunction, unspecified: Secondary | ICD-10-CM | POA: Diagnosis not present

## 2018-03-30 DIAGNOSIS — I10 Essential (primary) hypertension: Secondary | ICD-10-CM | POA: Diagnosis not present

## 2018-03-30 DIAGNOSIS — C61 Malignant neoplasm of prostate: Secondary | ICD-10-CM | POA: Diagnosis present

## 2018-03-30 DIAGNOSIS — M1990 Unspecified osteoarthritis, unspecified site: Secondary | ICD-10-CM | POA: Diagnosis not present

## 2018-03-30 DIAGNOSIS — E7800 Pure hypercholesterolemia, unspecified: Secondary | ICD-10-CM | POA: Insufficient documentation

## 2018-03-30 DIAGNOSIS — K219 Gastro-esophageal reflux disease without esophagitis: Secondary | ICD-10-CM | POA: Insufficient documentation

## 2018-03-30 DIAGNOSIS — Z8042 Family history of malignant neoplasm of prostate: Secondary | ICD-10-CM | POA: Diagnosis not present

## 2018-03-30 DIAGNOSIS — Z79899 Other long term (current) drug therapy: Secondary | ICD-10-CM | POA: Insufficient documentation

## 2018-03-30 DIAGNOSIS — J029 Acute pharyngitis, unspecified: Secondary | ICD-10-CM | POA: Diagnosis not present

## 2018-03-30 DIAGNOSIS — M25511 Pain in right shoulder: Secondary | ICD-10-CM | POA: Insufficient documentation

## 2018-03-30 DIAGNOSIS — G4730 Sleep apnea, unspecified: Secondary | ICD-10-CM | POA: Insufficient documentation

## 2018-03-30 DIAGNOSIS — E119 Type 2 diabetes mellitus without complications: Secondary | ICD-10-CM | POA: Insufficient documentation

## 2018-03-30 HISTORY — PX: PELVIC LYMPH NODE DISSECTION: SHX6543

## 2018-03-30 HISTORY — PX: ROBOT ASSISTED LAPAROSCOPIC RADICAL PROSTATECTOMY: SHX5141

## 2018-03-30 LAB — ABO/RH: ABO/RH(D): A POS

## 2018-03-30 LAB — BASIC METABOLIC PANEL
Anion gap: 8 (ref 5–15)
BUN: 15 mg/dL (ref 6–20)
CHLORIDE: 107 mmol/L (ref 98–111)
CO2: 27 mmol/L (ref 22–32)
Calcium: 8.9 mg/dL (ref 8.9–10.3)
Creatinine, Ser: 0.9 mg/dL (ref 0.61–1.24)
GFR calc Af Amer: >60 mL/min (ref >60)
GFR calc non Af Amer: >60 mL/min (ref >60)
Glucose, Bld: 164 mg/dL — ABNORMAL HIGH (ref 70–99)
POTASSIUM: 3.6 mmol/L (ref 3.5–5.1)
Sodium: 142 mmol/L (ref 135–145)

## 2018-03-30 LAB — GLUCOSE, CAPILLARY
GLUCOSE-CAPILLARY: 113 mg/dL — AB (ref 70–99)
GLUCOSE-CAPILLARY: 150 mg/dL — AB (ref 70–99)

## 2018-03-30 LAB — HEMOGLOBIN AND HEMATOCRIT, BLOOD
HCT: 36.9 % — ABNORMAL LOW (ref 39.0–52.0)
Hemoglobin: 12.1 g/dL — ABNORMAL LOW (ref 13.0–17.0)

## 2018-03-30 LAB — TYPE AND SCREEN
ABO/RH(D): A POS
Antibody Screen: NEGATIVE

## 2018-03-30 SURGERY — PROSTATECTOMY, RADICAL, ROBOT-ASSISTED, LAPAROSCOPIC
Anesthesia: General

## 2018-03-30 MED ORDER — ROCURONIUM BROMIDE 100 MG/10ML IV SOLN
INTRAVENOUS | Status: AC
  Filled 2018-03-30: qty 1

## 2018-03-30 MED ORDER — HYDROMORPHONE HCL 2 MG/ML IJ SOLN
INTRAMUSCULAR | Status: AC
  Filled 2018-03-30: qty 1

## 2018-03-30 MED ORDER — SODIUM CHLORIDE 0.9 % IJ SOLN
INTRAMUSCULAR | Status: AC
  Filled 2018-03-30: qty 10

## 2018-03-30 MED ORDER — PROPOFOL 10 MG/ML IV BOLUS
INTRAVENOUS | Status: AC
  Filled 2018-03-30: qty 20

## 2018-03-30 MED ORDER — PROPOFOL 10 MG/ML IV BOLUS
INTRAVENOUS | Status: AC
  Filled 2018-03-30: qty 60

## 2018-03-30 MED ORDER — MEPERIDINE HCL 50 MG/ML IJ SOLN: 6.2500 mg | INTRAMUSCULAR | Status: DC | PRN

## 2018-03-30 MED ORDER — ACETAMINOPHEN 10 MG/ML IV SOLN
INTRAVENOUS | Status: AC
  Filled 2018-03-30: qty 100

## 2018-03-30 MED ORDER — PROMETHAZINE HCL 25 MG/ML IJ SOLN: 6.2500 mg | INTRAMUSCULAR | Status: DC | PRN

## 2018-03-30 MED ORDER — HYDROMORPHONE HCL 1 MG/ML IJ SOLN
INTRAMUSCULAR | Status: DC | PRN
  Administered 2018-03-30 (×4): 0.5 mg via INTRAVENOUS

## 2018-03-30 MED ORDER — LIDOCAINE 2% (20 MG/ML) 5 ML SYRINGE
INTRAMUSCULAR | Status: DC | PRN
  Administered 2018-03-30: 100 mg via INTRAVENOUS

## 2018-03-30 MED ORDER — ACETAMINOPHEN 10 MG/ML IV SOLN
1000.0000 mg | Freq: Four times a day (QID) | INTRAVENOUS | Status: AC
  Administered 2018-03-30 – 2018-03-31 (×4): 1000 mg via INTRAVENOUS
  Filled 2018-03-30 (×4): qty 100

## 2018-03-30 MED ORDER — LACTATED RINGERS IR SOLN
Status: DC | PRN
  Administered 2018-03-30: 1000 mL

## 2018-03-30 MED ORDER — SODIUM CHLORIDE 0.9 % IV SOLN
INTRAVENOUS | Status: DC
  Administered 2018-03-30 – 2018-03-31 (×2): via INTRAVENOUS

## 2018-03-30 MED ORDER — SODIUM CHLORIDE 0.9 % IJ SOLN
INTRAMUSCULAR | Status: DC | PRN
  Administered 2018-03-30: 20 mL

## 2018-03-30 MED ORDER — STERILE WATER FOR IRRIGATION IR SOLN
Status: DC | PRN
  Administered 2018-03-30: 1000 mL

## 2018-03-30 MED ORDER — SUFENTANIL CITRATE 50 MCG/ML IV SOLN
INTRAVENOUS | Status: AC
  Filled 2018-03-30: qty 1

## 2018-03-30 MED ORDER — MIDAZOLAM HCL 2 MG/2ML IJ SOLN
INTRAMUSCULAR | Status: DC | PRN
  Administered 2018-03-30: 2 mg via INTRAVENOUS

## 2018-03-30 MED ORDER — PROPOFOL 10 MG/ML IV BOLUS
INTRAVENOUS | Status: DC | PRN
  Administered 2018-03-30: 200 mg via INTRAVENOUS

## 2018-03-30 MED ORDER — ROCURONIUM BROMIDE 100 MG/10ML IV SOLN
INTRAVENOUS | Status: DC | PRN
  Administered 2018-03-30: 10 mg via INTRAVENOUS
  Administered 2018-03-30 (×2): 20 mg via INTRAVENOUS
  Administered 2018-03-30: 50 mg via INTRAVENOUS
  Administered 2018-03-30 (×2): 20 mg via INTRAVENOUS

## 2018-03-30 MED ORDER — ACETAMINOPHEN 10 MG/ML IV SOLN
INTRAVENOUS | Status: DC | PRN
  Administered 2018-03-30: 1000 mg via INTRAVENOUS

## 2018-03-30 MED ORDER — DEXAMETHASONE SODIUM PHOSPHATE 10 MG/ML IJ SOLN
INTRAMUSCULAR | Status: DC | PRN
  Administered 2018-03-30: 10 mg via INTRAVENOUS

## 2018-03-30 MED ORDER — HYDROMORPHONE HCL 1 MG/ML IJ SOLN: 0.2500 mg | INTRAMUSCULAR | Status: DC | PRN

## 2018-03-30 MED ORDER — LACTATED RINGERS IV SOLN: INTRAVENOUS | Status: DC

## 2018-03-30 MED ORDER — DEXAMETHASONE SODIUM PHOSPHATE 10 MG/ML IJ SOLN
INTRAMUSCULAR | Status: AC
  Filled 2018-03-30: qty 1

## 2018-03-30 MED ORDER — SUGAMMADEX SODIUM 200 MG/2ML IV SOLN
INTRAVENOUS | Status: DC | PRN
  Administered 2018-03-30: 250 mg via INTRAVENOUS

## 2018-03-30 MED ORDER — LACTATED RINGERS IV SOLN
INTRAVENOUS | Status: DC
  Administered 2018-03-30 (×3): via INTRAVENOUS

## 2018-03-30 MED ORDER — SODIUM CHLORIDE 0.9 % IJ SOLN
INTRAMUSCULAR | Status: AC
  Filled 2018-03-30: qty 20

## 2018-03-30 MED ORDER — MIDAZOLAM HCL 2 MG/2ML IJ SOLN
INTRAMUSCULAR | Status: AC
  Filled 2018-03-30: qty 2

## 2018-03-30 MED ORDER — ONDANSETRON HCL 4 MG/2ML IJ SOLN
INTRAMUSCULAR | Status: AC
  Filled 2018-03-30: qty 2

## 2018-03-30 MED ORDER — MORPHINE SULFATE (PF) 4 MG/ML IV SOLN: 2.0000 mg | INTRAVENOUS | Status: DC | PRN

## 2018-03-30 MED ORDER — ONDANSETRON HCL 4 MG/2ML IJ SOLN
INTRAMUSCULAR | Status: DC | PRN
  Administered 2018-03-30: 4 mg via INTRAVENOUS

## 2018-03-30 MED ORDER — PROPOFOL 500 MG/50ML IV EMUL
INTRAVENOUS | Status: DC | PRN
  Administered 2018-03-30: 160 ug/kg/min via INTRAVENOUS

## 2018-03-30 MED ORDER — EPHEDRINE 5 MG/ML INJ
INTRAVENOUS | Status: AC
  Filled 2018-03-30: qty 10

## 2018-03-30 MED ORDER — BUPIVACAINE LIPOSOME 1.3 % IJ SUSP
20.0000 mL | Freq: Once | INTRAMUSCULAR | Status: AC
  Administered 2018-03-30: 20 mL
  Filled 2018-03-30: qty 20

## 2018-03-30 MED ORDER — CEFAZOLIN SODIUM-DEXTROSE 2-4 GM/100ML-% IV SOLN
2.0000 g | INTRAVENOUS | Status: AC
  Administered 2018-03-30: 2 g via INTRAVENOUS
  Filled 2018-03-30: qty 100

## 2018-03-30 MED ORDER — OXYCODONE HCL 5 MG PO TABS
5.0000 mg | ORAL_TABLET | ORAL | Status: DC | PRN
  Administered 2018-03-31: 5 mg via ORAL
  Filled 2018-03-30 (×2): qty 1

## 2018-03-30 MED ORDER — SUGAMMADEX SODIUM 500 MG/5ML IV SOLN
INTRAVENOUS | Status: AC
  Filled 2018-03-30: qty 5

## 2018-03-30 MED ORDER — ONDANSETRON HCL 4 MG/2ML IJ SOLN: 4.0000 mg | INTRAMUSCULAR | Status: DC | PRN

## 2018-03-30 MED ORDER — SUFENTANIL CITRATE 50 MCG/ML IV SOLN
INTRAVENOUS | Status: DC | PRN
  Administered 2018-03-30: 20 ug via INTRAVENOUS
  Administered 2018-03-30 (×3): 10 ug via INTRAVENOUS

## 2018-03-30 MED ORDER — EPHEDRINE SULFATE-NACL 50-0.9 MG/10ML-% IV SOSY
PREFILLED_SYRINGE | INTRAVENOUS | Status: DC | PRN
  Administered 2018-03-30 (×5): 10 mg via INTRAVENOUS

## 2018-03-30 SURGICAL SUPPLY — 64 items
ADH SKN CLS APL DERMABOND .7 (GAUZE/BANDAGES/DRESSINGS) ×2
AGENT HMST KT MTR STRL THRMB (HEMOSTASIS) ×2
APL ESCP 34 STRL LF DISP (HEMOSTASIS) ×2
APL SWBSTK 6 STRL LF DISP (MISCELLANEOUS) ×2
APPLICATOR COTTON TIP 6 STRL (MISCELLANEOUS) ×2 IMPLANT
APPLICATOR COTTON TIP 6IN STRL (MISCELLANEOUS) ×4
APPLICATOR SURGIFLO ENDO (HEMOSTASIS) ×2 IMPLANT
CATH FOLEY 2WAY SLVR  5CC 18FR (CATHETERS) ×2
CATH FOLEY 2WAY SLVR 5CC 18FR (CATHETERS) ×2 IMPLANT
CATH TIEMANN FOLEY 18FR 5CC (CATHETERS) ×4 IMPLANT
CHLORAPREP W/TINT 26ML (MISCELLANEOUS) ×4 IMPLANT
CLIP VESOLOCK LG 6/CT PURPLE (CLIP) ×8 IMPLANT
COVER SURGICAL LIGHT HANDLE (MISCELLANEOUS) ×4 IMPLANT
COVER TIP SHEARS 8 DVNC (MISCELLANEOUS) ×2 IMPLANT
COVER TIP SHEARS 8MM DA VINCI (MISCELLANEOUS) ×2
CUTTER ECHEON FLEX ENDO 45 340 (ENDOMECHANICALS) ×4 IMPLANT
DECANTER SPIKE VIAL GLASS SM (MISCELLANEOUS) ×4 IMPLANT
DERMABOND ADVANCED (GAUZE/BANDAGES/DRESSINGS) ×2
DERMABOND ADVANCED .7 DNX12 (GAUZE/BANDAGES/DRESSINGS) IMPLANT
DRAPE ARM DVNC X/XI (DISPOSABLE) ×8 IMPLANT
DRAPE COLUMN DVNC XI (DISPOSABLE) ×2 IMPLANT
DRAPE DA VINCI XI ARM (DISPOSABLE) ×8
DRAPE DA VINCI XI COLUMN (DISPOSABLE) ×2
DRAPE SURG IRRIG POUCH 19X23 (DRAPES) ×4 IMPLANT
DRSG TEGADERM 4X4.75 (GAUZE/BANDAGES/DRESSINGS) ×4 IMPLANT
ELECT REM PT RETURN 15FT ADLT (MISCELLANEOUS) ×4 IMPLANT
FLOSEAL 10ML (HEMOSTASIS) IMPLANT
GLOVE BIO SURGEON STRL SZ 6.5 (GLOVE) ×3 IMPLANT
GLOVE BIO SURGEON STRL SZ7.5 (GLOVE) ×8 IMPLANT
GLOVE BIO SURGEONS STRL SZ 6.5 (GLOVE) ×1
GOWN STRL REUS W/TWL LRG LVL3 (GOWN DISPOSABLE) ×4 IMPLANT
GOWN STRL REUS W/TWL XL LVL3 (GOWN DISPOSABLE) ×8 IMPLANT
HEMOSTAT SURGICEL 4X8 (HEMOSTASIS) ×2 IMPLANT
HOLDER FOLEY CATH W/STRAP (MISCELLANEOUS) ×4 IMPLANT
IRRIG SUCT STRYKERFLOW 2 WTIP (MISCELLANEOUS) ×4
IRRIGATION SUCT STRKRFLW 2 WTP (MISCELLANEOUS) ×2 IMPLANT
IV LACTATED RINGERS 1000ML (IV SOLUTION) ×4 IMPLANT
NDL INSUFFLATION 14GA 120MM (NEEDLE) ×2 IMPLANT
NEEDLE INSUFFLATION 14GA 120MM (NEEDLE) ×4 IMPLANT
PACK ROBOT UROLOGY CUSTOM (CUSTOM PROCEDURE TRAY) ×4 IMPLANT
PAD POSITIONING PINK XL (MISCELLANEOUS) ×4 IMPLANT
PORT ACCESS TROCAR AIRSEAL 12 (TROCAR) ×2 IMPLANT
PORT ACCESS TROCAR AIRSEAL 5M (TROCAR) ×2
RELOAD STAPLE 45 4.1 GRN THCK (STAPLE) ×2 IMPLANT
SCISSORS LAP 5X45 EPIX DISP (ENDOMECHANICALS) ×2 IMPLANT
SEAL CANN UNIV 5-8 DVNC XI (MISCELLANEOUS) ×8 IMPLANT
SEAL XI 5MM-8MM UNIVERSAL (MISCELLANEOUS) ×8
SET TRI-LUMEN FLTR TB AIRSEAL (TUBING) ×4 IMPLANT
SOLUTION ELECTROLUBE (MISCELLANEOUS) ×4 IMPLANT
SPONGE LAP 4X18 RFD (DISPOSABLE) ×4 IMPLANT
STAPLE RELOAD 45 GRN (STAPLE) ×2 IMPLANT
STAPLE RELOAD 45MM GREEN (STAPLE) ×4
SURGIFLO W/THROMBIN 8M KIT (HEMOSTASIS) ×2 IMPLANT
SUT ETHILON 3 0 PS 1 (SUTURE) ×4 IMPLANT
SUT MNCRL AB 4-0 PS2 18 (SUTURE) ×8 IMPLANT
SUT PROLENE 2 0 CT 1 (SUTURE) ×4 IMPLANT
SUT VIC AB 0 UR5 27 (SUTURE) ×4 IMPLANT
SUT VIC AB 2-0 SH 27 (SUTURE) ×4
SUT VIC AB 2-0 SH 27X BRD (SUTURE) ×2 IMPLANT
SUT VICRYL 0 UR6 27IN ABS (SUTURE) ×8 IMPLANT
SUT VLOC BARB 180 ABS3/0GR12 (SUTURE) ×8
SUTURE VLOC BRB 180 ABS3/0GR12 (SUTURE) ×4 IMPLANT
TOWEL OR NON WOVEN STRL DISP B (DISPOSABLE) ×4 IMPLANT
WATER STERILE IRR 1000ML POUR (IV SOLUTION) ×4 IMPLANT

## 2018-03-30 NOTE — Op Note (Signed)
Operative Note  Preoperative diagnosis:  1.  Prostate cancer   Postoperative diagnosis: 1.  Prostate cancer  Procedure(s): 1.  Robotic assisted laparoscopic prostatectomy with bilateral pelvic lymph node dissection 2.  Lysis of intra-abdominal adhesions greater than 30 minutes  Surgeon: Link Snuffer, MD  Assistants: Basilio Cairo, MD--resident--an assistant was needed due to the nature of the case being a robotic surgery requiring a bedside assistant for retraction, suctioning, exchanging instruments, etc.   Anesthesia: General   Complications: None   EBL: 100 cc   Specimens: 1. prostate and seminal vesicles 2.  Periprosthetic fat 3.  Right pelvic lymph node packet 4.  Left pelvic lymph node packet 5.  Bladder neck margin  Drains/Catheters: 1. 4 French Foley catheter and JP drain   Intraoperative findings: prostate and seminal vesicles were removed en bloc Without any evidence of gross extraprostatic disease.  Large amount of adhesions that were released in the right lower quadrant from the mesh.  Indication: 57 year old male recently diagnosed with prostate cancer desires the above intervention.  Of note, he had a history of open appendectomy that was complicated by infection and subsequently complicated by incisional hernia status post laparoscopic repair with mesh.  Description of procedure:  The patient was identified and consent was obtained.  The patient was taken to the operating room and placed in the supine position.  The patient was placed under general anesthesia.  Perioperative antibiotics were administered.  The patient was placed in dorsal lithotomy.  Patient was prepped and draped in a standard sterile fashion and a timeout was performed.  The patient was placed in steep Trendelenburg.  Foley catheter was placed.  Veress needle was inserted in the left quadrant at the midline and the drop test was performed as was aspiration which were negative for injury.  I  then insufflated the abdomen to a pressure of 15.  I placed the first trocar in the left medial position and inspected the abdomen with the camera.  There were no adhesions on the left side and no visceral or bowel injury.  There was a large amount of adhesions in the right lower quadrant adherent to the previously placed mesh.  There was no evidence of any bowel injury after lysis of adhesions greater than 30 minutes.  The adhesions were taken down sharply with spot electrocautery far away from the bowel.  The remainder of the robotic working ports were then placed under direct visualization in the standard fashion for a robotic pelvic surgery.  The abdomen was inspected and there was no evidence of any visceral or vascular injury.  I first released colonic adhesions in the left lower quadrant.  I then retracted the sigmoid and rectum superiorly.  I first started with the posterior dissection by incising along the posterior peritoneum and identifying bilateral vas deferens.  These were divided and bilateral seminal vesicles were carefully dissected out.  Denonvier fascia was then entered posteriorly.  The bladder was then dropped by incising along the medial umbilical ligament bilaterally.  Fortunately, the mesh was placed laterally enough to where it did not interfere too much with the right sided dissection of the bladder.  Is no evidence of any bladder injury.  Periprosthetic fat was cleared off the prostate and passed off and passed off for specimen.  The endopelvic fascia was incised bilaterally and the puboprostatic ligaments were released.  A stapler with a vascular staple load was used to staple the dorsal venous complex.  I then incised along into the  bladder neck and divided the bladder neck.  The catheter was brought out and used for retraction.  I divided the remainder of the bladder neck and then pulled the seminal vesicles out of this incision.  Careful dissection was performed and bilateral  prostatic pedicles were released using Hem-o-lok clips and sharp dissection.  Spot cautery was sparingly used.  Periprosthetic tissue was carefully released inferiorly and laterally, performing a nerve sparing operation bilaterally.  Once only the urethra remained attached, the anterior portion of the urethra was divided.  A 2-0 Vicryl stitch was placed at the 6 o'clock position.  The remainder of the urethra was divided and the prostate was placed in a specimen bag.  FloSeal was applied to the prostatectomy bed.  I then performed the bilateral pelvic lymph node dissection.  I first carefully removed lymph node packet from the right side overlying the external iliac artery and vein down to the level of the obturator nerve.  This was passed off for specimen and then I carefully removed the left sided pelvic lymph node packets again overlying the external iliac artery and vein down to the level of the obturator nerve taking care not to injure any vital structures.  Surgicel was placed on the left lymph node dissection area.  The 2-0 Vicryl stitch was used to reapproximate the bladder and urethra at the 6 o'clock position.  A running 3-0 V lock suture was then used to reapproximate the bladder and urethra.  The bladder was filled with normal saline and there was no evidence of any leak at the anastomosis.  The anastomosis was watertight.  The Foley catheter was exchanged for a fresh catheter.  A JP drain was inserted from the left lateral port site.  This was secured down with a nylon stitch.  The robot was undocked and the patient was taken out of Trendelenburg.  All working ports were removed under direct visualization with the camera to ensure there was no bleeding from the sites.  The midline port incision was extended and the prostate extracted in the specimen bag.  Interrupted figure-of-eight 0 Vicryl sutures were used to close  the fascia.  The 12 mm assistant port fascia was also closed with a 2-0  proline.  Skin was then closed with 4-0 Monocryl and Dermabond.  Exparel was used for anesthetic effect.  This concluded the operation.  The patient tolerated procedure well and was stable postoperatively.  Plan: Will obtain stat labs.  He will remain in the hospital overnight and hopefully be able to be discharged tomorrow.  He will keep his catheter for 7-10 days.

## 2018-03-30 NOTE — Anesthesia Preprocedure Evaluation (Addendum)
Anesthesia Evaluation  Patient identified by MRN, date of birth, ID band Patient awake    Reviewed: Allergy & Precautions, NPO status , Patient's Chart, lab work & pertinent test results  Airway Mallampati: I  TM Distance: >3 FB Neck ROM: Full    Dental  (+) Teeth Intact, Dental Advisory Given   Pulmonary sleep apnea ,    breath sounds clear to auscultation       Cardiovascular hypertension, Pt. on medications  Rhythm:Regular Rate:Normal     Neuro/Psych negative neurological ROS  negative psych ROS   GI/Hepatic Neg liver ROS, GERD  ,  Endo/Other  diabetes  Renal/GU      Musculoskeletal  (+) Arthritis , Osteoarthritis,    Abdominal Normal abdominal exam  (+)   Peds  Hematology negative hematology ROS (+)   Anesthesia Other Findings   Reproductive/Obstetrics                            Lab Results  Component Value Date   WBC 8.1 03/12/2018   HGB 14.0 03/12/2018   HCT 42.1 03/12/2018   MCV 88.1 03/12/2018   PLT 233 03/12/2018   Lab Results  Component Value Date   CREATININE 0.79 03/12/2018   BUN 22 (H) 03/12/2018   NA 141 03/12/2018   K 4.1 03/12/2018   CL 105 03/12/2018   CO2 27 03/12/2018   Lab Results  Component Value Date   INR 0.90 03/12/2018   EKG: sinus bradycardia.  Anesthesia Physical Anesthesia Plan  ASA: II  Anesthesia Plan: General   Post-op Pain Management:    Induction: Intravenous  PONV Risk Score and Plan: 3 and Ondansetron, Dexamethasone, Midazolam and TIVA  Airway Management Planned: Oral ETT  Additional Equipment: None  Intra-op Plan:   Post-operative Plan: Extubation in OR  Informed Consent: I have reviewed the patients History and Physical, chart, labs and discussed the procedure including the risks, benefits and alternatives for the proposed anesthesia with the patient or authorized representative who has indicated his/her understanding and  acceptance.   Dental advisory given  Plan Discussed with: CRNA  Anesthesia Plan Comments: (Mutual History negative. Uneventful anesthetic with MH triggering agents in the past. TIVA today to reduce waste only. )      Anesthesia Quick Evaluation

## 2018-03-30 NOTE — H&P (Signed)
CC: I have prostate cancer.  HPI: Edward Arroyo is a 57 year-old male established patient who is here evaluation for treatment of prostate cancer.  His prostate cancer was diagnosed 02/26/2018. He does have the pathology report from his biopsy. His cancer was diagnosed by Dr. Gloriann Loan. His PSA at his time of diagnosis was 4.35.   He has not undergone surgery for treatment. He has not undergone External Beam Radiation Therapy for treatment. He has not undergone Hormonal Therapy for treatment.   He does not have urinary incontinence. He does have problems with erectile dysfunction. He has not recently had unwanted weight loss. He is not having pain in new locations.   PSA 4.35  Digital rectal exam: 20 g with right induration  Abdominal surgery: Open appendectomy that was complicated by infection and subsequent hernia formation that required eventual mesh placement   TRUS size 44.4 g  Some erectile dysfunction but is still sexually active. Does not use anything.  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 There were 2 cores of atypia on the left.   Continues to have some gross hematuria but he mowed the lawn. He otherwise is doing well.     ALLERGIES: None   MEDICATIONS: Benicar 20 mg tablet  Saw Palmetto     GU PSH: Prostate Needle Biopsy - 02/25/2018    NON-GU PSH: Appendectomy (laparoscopic) Hernia Repair, Right Surgical Pathology, Gross And Microscopic Examination For Prostate Needle - 02/25/2018    GU PMH: Elevated PSA - 02/25/2018, - 02/20/2018 Prostate nodule w/o LUTS - 02/20/2018    NON-GU PMH: Hypercholesterolemia Hypertension    FAMILY HISTORY: 2 daughters - Other 1 son - Other Cancer - Father Diabetes - Runs in Family Hypertension - Runs in Family Prostate Cancer - Brother, Father   SOCIAL HISTORY: Marital Status: Married Preferred Language: English; Race:  White Current Smoking Status: Patient has never smoked.   Tobacco Use Assessment Completed: Used Tobacco in last 30 days? Does not drink caffeine. Patient's occupation Publishing copy.    REVIEW OF SYSTEMS:    GU Review Male:   Patient denies frequent urination, hard to postpone urination, burning/ pain with urination, get up at night to urinate, leakage of urine, stream starts and stops, trouble starting your stream, have to strain to urinate , erection problems, and penile pain.  Gastrointestinal (Upper):   Patient denies nausea, vomiting, and indigestion/ heartburn.  Gastrointestinal (Lower):   Patient denies diarrhea and constipation.  Constitutional:   Patient denies fever, night sweats, weight loss, and fatigue.  Skin:   Patient denies skin rash/ lesion and itching.  Eyes:   Patient denies blurred vision and double vision.  Ears/ Nose/ Throat:   Patient denies sore throat and sinus problems.  Hematologic/Lymphatic:   Patient denies swollen glands and easy bruising.  Cardiovascular:   Patient denies leg swelling and chest pains.  Respiratory:   Patient denies cough and shortness of breath.  Endocrine:   Patient denies excessive thirst.  Musculoskeletal:   Patient denies back pain and joint pain.  Neurological:   Patient denies headaches and dizziness.  Psychologic:   Patient denies depression and anxiety.   VITAL SIGNS: None   MULTI-SYSTEM PHYSICAL EXAMINATION:    Constitutional: Well-nourished. No physical deformities. Normally developed. Good grooming.  Respiratory: No labored breathing, no use of accessory muscles.   Cardiovascular: Normal temperature, normal extremity pulses, no swelling, no varicosities.  Skin: No paleness, no jaundice, no cyanosis. No lesion, no ulcer, no rash.  Neurologic / Psychiatric: Oriented to time, oriented to place, oriented to person. No depression, no anxiety, no agitation.  Gastrointestinal: No mass, no tenderness, no rigidity, non obese abdomen.  Right lower quadrant incision from his prior open appendectomy. Otherwise no incisions.  Eyes: Normal conjunctivae. Normal eyelids.  Musculoskeletal: Normal gait and station of head and neck.     PAST DATA REVIEWED:  Source Of History:  Patient  Lab Test Review:   Path Report   PROCEDURES:          Urinalysis w/Scope Dipstick Dipstick Cont'd Micro  Color: Yellow Bilirubin: Neg mg/dL WBC/hpf: 10 - 20/hpf  Appearance: Cloudy Ketones: Neg mg/dL RBC/hpf: >60/hpf  Specific Gravity: 1.025 Blood: 3+ ery/uL Bacteria: Few (10-25/hpf)  pH: <=5.0 Protein: Trace mg/dL Cystals: NS (Not Seen)  Glucose: Neg mg/dL Urobilinogen: 0.2 mg/dL Casts: NS (Not Seen)    Nitrites: Neg Trichomonas: Not Present    Leukocyte Esterase: 3+ leu/uL Mucous: Not Present      Epithelial Cells: 0 - 5/hpf      Yeast: NS (Not Seen)      Sperm: Not Present    ASSESSMENT:      ICD-10 Details  1 GU:   Prostate Cancer - C61    PLAN:           Orders Labs BUN/Creatinine          Schedule X-Rays: 1 Week - Bone Scan    1 Week - C.T. Abdomen/Pelvis With I.V. Contrast  Return Visit/Planned Activity: 1 Week - C.T. Abdomen/Pelvis, Bone Scan  Return Visit/Planned Activity: Next Available Appointment - Schedule Surgery  Return Visit/Planned Activity: Next Available Appointment - PT/OT Referral          Document Letter(s):  Created for Patient: Clinical Summary         Notes:   I went over his pathology report with him today as well as the significance of his Gleason score, number and location of cores positive and percent of cores positive.  The patient was counseled about the natural history of prostate cancer and the standard treatment options that are available for prostate cancer. It was explained to him how his age and life expectancy, clinical stage, Gleason score, and PSA affect his prognosis, the decision to proceed with additional staging studies, as well as how that information influences recommended treatment  strategies. We discussed the roles for active surveillance, radiation therapy, surgical therapy, androgen deprivation, as well as ablative therapy options for the treatment of prostate cancer as appropriate to his individual cancer situation. We discussed the risks and benefits of these options with regard to their impact on cancer control and also in terms of potential adverse events, complications, and impact on quiality of life particularly related to urinary, bowel, and sexual function. The patient was encouraged to ask questions throughout the discussion today and all questions were answered to his stated satisfaction. In addition, the patient was provided with and/or directed to appropriate resources and literature for further education about prostate cancer and treatment options.   Given the recent onset of new back pain, recommend bone scan and CT scan to rule out metastatic disease given his primary Gleason score of 4.   He would like to proceed with robotic-assisted laparoscopic prostatectomy. He understands he may be at increased risk given his history of open appendectomy that was complicated by infection and subsequent hernia formation that required mesh insertion.  He wishes to proceed. He understands long-term risk of possible erectile dysfunction and urinary incontinence which an selective individuals can be quite severe.        Next Appointment:      Next Appointment: 03/09/2018 09:00 AM    Appointment Type: 60 Physical Therapy    Location: [facility] [phone]    Provider: Ileana Roup    Reason for Visit: pt    Signed by Link Snuffer, III, M.D. on 03/05/18 at 8:28 AM (EDT

## 2018-03-30 NOTE — Anesthesia Procedure Notes (Signed)
Procedure Name: Intubation Date/Time: 03/30/2018 11:34 AM Performed by: Sharlette Dense, CRNA Patient Re-evaluated:Patient Re-evaluated prior to induction Oxygen Delivery Method: Circle system utilized Preoxygenation: Pre-oxygenation with 100% oxygen Induction Type: IV induction Ventilation: Mask ventilation without difficulty and Oral airway inserted - appropriate to patient size Laryngoscope Size: Miller and 3 Grade View: Grade I Tube type: Oral Tube size: 8.0 mm Number of attempts: 1 Airway Equipment and Method: Stylet Placement Confirmation: ETT inserted through vocal cords under direct vision,  positive ETCO2 and breath sounds checked- equal and bilateral Secured at: 23 cm Dental Injury: Teeth and Oropharynx as per pre-operative assessment

## 2018-03-30 NOTE — Anesthesia Postprocedure Evaluation (Signed)
Anesthesia Post Note  Patient: Edward Arroyo  Procedure(s) Performed: XI ROBOTIC ASSISTED LAPAROSCOPIC RADICAL PROSTATECTOMY (N/A ) PELVIC LYMPH NODE DISSECTION (Bilateral )     Patient location during evaluation: PACU Anesthesia Type: General Level of consciousness: awake and alert Pain management: pain level controlled Vital Signs Assessment: post-procedure vital signs reviewed and stable Respiratory status: spontaneous breathing, nonlabored ventilation, respiratory function stable and patient connected to nasal cannula oxygen Cardiovascular status: blood pressure returned to baseline and stable Postop Assessment: no apparent nausea or vomiting Anesthetic complications: no    Last Vitals:  Vitals:   03/30/18 1630 03/30/18 1645  BP:    Pulse: 64   Resp: 12   Temp:  36.7 C  SpO2:      Last Pain:  Vitals:   03/30/18 1645  TempSrc:   PainSc: 0-No pain                 Effie Berkshire

## 2018-03-30 NOTE — Transfer of Care (Signed)
Immediate Anesthesia Transfer of Care Note  Patient: Edward Arroyo  Procedure(s) Performed: XI ROBOTIC ASSISTED LAPAROSCOPIC RADICAL PROSTATECTOMY (N/A ) PELVIC LYMPH NODE DISSECTION (Bilateral )  Patient Location: PACU  Anesthesia Type:General  Level of Consciousness: awake  Airway & Oxygen Therapy: Patient Spontanous Breathing and Patient connected to face mask oxygen  Post-op Assessment: Report given to RN and Post -op Vital signs reviewed and stable  Post vital signs: Reviewed and stable  Last Vitals:  Vitals Value Taken Time  BP 109/63 03/30/2018  3:31 PM  Temp    Pulse 63 03/30/2018  3:33 PM  Resp 11 03/30/2018  3:33 PM  SpO2 96 % 03/30/2018  3:33 PM  Vitals shown include unvalidated device data.  Last Pain:  Vitals:   03/30/18 1037  TempSrc:   PainSc: 0-No pain         Complications: No apparent anesthesia complications

## 2018-03-30 NOTE — Discharge Instructions (Signed)

## 2018-03-31 ENCOUNTER — Encounter (HOSPITAL_COMMUNITY): Payer: Self-pay | Admitting: Urology

## 2018-03-31 DIAGNOSIS — C61 Malignant neoplasm of prostate: Secondary | ICD-10-CM | POA: Diagnosis not present

## 2018-03-31 LAB — BASIC METABOLIC PANEL
ANION GAP: 10 (ref 5–15)
BUN: 14 mg/dL (ref 6–20)
CO2: 25 mmol/L (ref 22–32)
Calcium: 9.1 mg/dL (ref 8.9–10.3)
Chloride: 103 mmol/L (ref 98–111)
Creatinine, Ser: 0.75 mg/dL (ref 0.61–1.24)
GFR calc Af Amer: >60 mL/min (ref >60)
GFR calc non Af Amer: >60 mL/min (ref >60)
Glucose, Bld: 156 mg/dL — ABNORMAL HIGH (ref 70–99)
POTASSIUM: 4.4 mmol/L (ref 3.5–5.1)
Sodium: 138 mmol/L (ref 135–145)

## 2018-03-31 LAB — CBC
HEMATOCRIT: 34 % — AB (ref 39.0–52.0)
Hemoglobin: 11.4 g/dL — ABNORMAL LOW (ref 13.0–17.0)
MCH: 29.4 pg (ref 26.0–34.0)
MCHC: 33.5 g/dL (ref 30.0–36.0)
MCV: 87.6 fL (ref 80.0–100.0)
Platelets: 196 10*3/uL (ref 150–400)
RBC: 3.88 MIL/uL — AB (ref 4.22–5.81)
RDW: 12.9 % (ref 11.5–15.5)
WBC: 14.5 10*3/uL — AB (ref 4.0–10.5)

## 2018-03-31 MED ORDER — OXYCODONE HCL 5 MG PO TABS: 5.0000 mg | ORAL_TABLET | Freq: Three times a day (TID) | ORAL | 0 refills | Status: AC | PRN

## 2018-03-31 MED ORDER — SENNOSIDES-DOCUSATE SODIUM 8.6-50 MG PO TABS: 1.0000 | ORAL_TABLET | Freq: Every day | ORAL | 1 refills | Status: DC

## 2018-03-31 MED ORDER — SULFAMETHOXAZOLE-TRIMETHOPRIM 800-160 MG PO TABS: 1.0000 | ORAL_TABLET | Freq: Two times a day (BID) | ORAL | 0 refills | Status: DC

## 2018-03-31 MED ORDER — ACETAMINOPHEN 325 MG PO TABS
650.0000 mg | ORAL_TABLET | ORAL | Status: DC | PRN
  Administered 2018-03-31: 650 mg via ORAL
  Filled 2018-03-31: qty 2

## 2018-03-31 NOTE — Progress Notes (Addendum)
Urology Progress Note   1 Day Post-Op  Subjective: NAEON.  Patient doing well this am. Tolerating clears, ambulating several times on floor Chief complaint of right shoulder pain and sore throat Surgical pain well controlled Excellent UOP, Cr wnl, Hgb stable JP 465cc sanguinous  Objective: Vital signs in last 24 hours: Temp:  [97.7 F (36.5 C)-98.3 F (36.8 C)] 97.8 F (36.6 C) (10/08 0935) Pulse Rate:  [59-71] 64 (10/08 0935) Resp:  [9-20] 18 (10/08 0935) BP: (104-134)/(56-74) 134/74 (10/08 0935) SpO2:  [93 %-100 %] 96 % (10/08 0935) Weight:  [113.4 kg] 113.4 kg (10/07 1702)  Intake/Output from previous day: 10/07 0701 - 10/08 0700 In: 4152.1 [P.O.:480; I.V.:3334; IV Piggyback:338.2] Out: 2645 [Urine:1830; Drains:465; Blood:350] Intake/Output this shift: Total I/O In: 120 [P.O.:120] Out: 660 [Urine:600; Drains:60]  Physical Exam:  General: Alert and oriented CV: RRR Lungs: Clear Abdomen: Soft, appropriately tender. Port incisions and midline extraction site clean dry and intact with surgical glue. JP with sanguinous output GU: Foley in place draining yellow urine with pink tinge  Ext: NT, No erythema  Lab Results: Recent Labs    03/30/18 1611 03/31/18 0417  HGB 12.1* 11.4*  HCT 36.9* 34.0*   BMET Recent Labs    03/30/18 1611 03/31/18 0417  NA 142 138  K 3.6 4.4  CL 107 103  CO2 27 25  GLUCOSE 164* 156*  BUN 15 14  CREATININE 0.90 0.75  CALCIUM 8.9 9.1     Studies/Results: No results found.  Assessment/Plan:  57 y.o. male s/p RALP + BPLND on 04/30/18.  Overall doing well post-op.   - regular diet, medlock - maintain foley catheter - will continue to monitor JP output. Will check AM Hgb as well - stay 1 more night due to discomfort, and slow recovery of PO intake - continue OOBTC, ambulation TID   Dispo: floor, anticipate discharge to home tomorrow   LOS: 0 days

## 2018-03-31 NOTE — Plan of Care (Signed)

## 2018-04-01 DIAGNOSIS — C61 Malignant neoplasm of prostate: Secondary | ICD-10-CM | POA: Diagnosis not present

## 2018-04-01 LAB — HEMOGLOBIN AND HEMATOCRIT, BLOOD
HCT: 34.7 % — ABNORMAL LOW (ref 39.0–52.0)
Hemoglobin: 11.2 g/dL — ABNORMAL LOW (ref 13.0–17.0)

## 2018-04-01 NOTE — Progress Notes (Signed)
Pt and wife asking for clarification from MD before removing JP drain. States MD mentioned checking urine level in JP drain prior to removing. MD Bell notified- stated to pull drain now as ordered. Will make pt aware.

## 2018-04-01 NOTE — Progress Notes (Signed)
Discharge paperwork explained to pt and wife who verbalized understanding. Printed prescription also given to pt. [facility] teaching done including how to empty drainage bag, leg bag instructions, keep drainage bag below level of bladder, how to clean FC, when to call MD... Extra supplies given to pt. Pt and wife verbalized they do feel comfortable taking care of FC at home. Did attend FC class. JP drain removed with no complications. Pt to be assisted to exit via W/C with assist from nursing staff.

## 2018-04-01 NOTE — Discharge Summary (Addendum)
[facility] Discharge Summary  Admit date: 03/30/2018  Discharge date and time: 04/01/18   Discharge to: Home  Discharge Service: Urology  Discharge Attending Physician:  Dr. Gloriann Loan  Discharge  Diagnoses: <principal problem not specified>  Secondary Diagnosis: Active Problems:   Prostate cancer (Edward Arroyo)   OR Procedures: Procedure(s): XI ROBOTIC ASSISTED LAPAROSCOPIC RADICAL PROSTATECTOMY PELVIC LYMPH NODE DISSECTION 03/30/2018   Ancillary Procedures: None   Discharge Day Services: The patient was seen and examined by the Urology team both in the morning and immediately prior to discharge.  Vital signs and laboratory values were stable and within normal limits.  The physical exam was benign and unchanged and all surgical wounds were examined.  Discharge instructions were explained and all questions answered.  Subjective  No acute events overnight. Pain Controlled. No fever or chills.  Objective Patient Vitals for the past 8 hrs:  BP Temp Temp src Pulse Resp SpO2  04/01/18 0429 (!) 141/83 98.7 F (37.1 C) Oral (!) 59 18 96 %   No intake/output data recorded.  General Appearance:        No acute distress Lungs:                       Normal work of breathing on room air Heart:                                Regular rate and rhythm Abdomen:                         Soft, nondistended, appropriately tender to palpation. Port incisions and midline extraction site clean dry and intact with surgical glue. JP site hemostatic Extremities:                      Warm and well perfused   Hospital Course:  The patient underwent a RALP + BPLND on 03/30/2018.  The patient tolerated the procedure well, was extubated in the OR, and afterwards was taken to the PACU for routine post-surgical care. When stable the patient was transferred to the floor.   The patient did well postoperatively.  The patient's diet was slowly advanced and at the time of discharge was tolerating a regular diet.  The  patient was discharged home 2 Days Post-Op, at which point was tolerating a regular solid diet, was able to void spontaneously, have adequate pain control with P.O. pain medication, and could ambulate without difficulty. The patient will follow up with [place] for post op check.   JP was removed prior to discharge.  Condition at Discharge: Improved  Discharge Medications:  Allergies as of 04/01/2018   No Known Allergies     Medication List    TAKE these medications   ELDERBERRY PO Take 1 capsule by mouth daily.   LYCOPENE PO Take 1 capsule by mouth daily.   olmesartan-hydrochlorothiazide 40-25 MG tablet Commonly known as:  BENICAR HCT Take 0.5 tablets by mouth daily.   oxyCODONE 5 MG immediate release tablet Commonly known as:  Oxy IR/ROXICODONE Take 1 tablet (5 mg total) by mouth every 8 (eight) hours as needed.   polyethylene glycol packet Commonly known as:  MIRALAX / GLYCOLAX Take 17 g by mouth daily as needed for mild constipation.   POMEGRANATE PO Take 2 capsules by mouth daily.   senna-docusate 8.6-50 MG tablet Commonly known as:  Senokot-S Take 1  tablet by mouth daily.   sulfamethoxazole-trimethoprim 800-160 MG tablet Commonly known as:  BACTRIM DS,SEPTRA DS Take 1 tablet by mouth 2 (two) times daily. Take the morning of your clinic visit

## 2018-05-01 ENCOUNTER — Ambulatory Visit: Payer: 59 | Attending: Urology | Admitting: Physical Therapy

## 2018-05-01 ENCOUNTER — Encounter: Payer: Self-pay | Admitting: Physical Therapy

## 2018-05-01 DIAGNOSIS — R278 Other lack of coordination: Secondary | ICD-10-CM | POA: Insufficient documentation

## 2018-05-01 DIAGNOSIS — M6281 Muscle weakness (generalized): Secondary | ICD-10-CM | POA: Insufficient documentation

## 2018-05-01 NOTE — Therapy (Signed)
[facility] [address], [place], [place], [address] Phone: [phone]   Fax:  [phone]  Physical Therapy Treatment  Patient Details  Name: Edward Arroyo MRN: [id number] Date of Birth: Jul 04, 1960 Referring Provider (PT): Dr. Link Snuffer   Encounter Date: 05/01/2018  PT End of Session - 05/01/18 1013    Visit Number  3    Date for PT Re-Evaluation  06/12/18    Authorization Type  cigna    PT Start Time  0930    PT Stop Time  1010    PT Time Calculation (min)  40 min    Activity Tolerance  Patient tolerated treatment well    Behavior During Therapy  [facility] for tasks assessed/performed       Past Medical History:  Diagnosis Date  . Arthritis    PT STATES A LOT OF JOINT PAIN-SUSPECTS ARTHRITIS  . Chronic back pain   . Constipation    PT HOPING CONSTIPATION IS RELATED TO HERNIA - ON DAILY LAXATIVE, resolved  . Diabetes mellitus    "BORDERLINE"  PT NOT ON ANY MEDICATIONS  . Diverticulosis   . Family history of anesthesia complication    PT'S GRANDSON -POSSIBLE MALIGNANT HYPERTHERMIA--GRANDSON HAVING MUSCLE BIOPSY TODAY  01/10/12   . Fatty liver   . Ganglion cyst    left ankle  . GERD (gastroesophageal reflux disease)    RECENT PROBLEMS-NO MEDS, no longer an issue  . Groin pain, right   . Heart murmur    AS A CHILD  . Hemorrhoids   . Hypertension   . IBS (irritable bowel syndrome)    resolved  . Incisional hernia    S/P APPENDECTOMY  . Lung nodule    8mm Left Lower Lobe  . Malignant hyperthermia    SEE FAMILY HX OF GRANDSON POSSIBLE MH. PT HAD APPENDECTOMY SEPT 2011 AT [facility] OF GRANDSON'S HISTORY OF "POSSIBLE" MALIGNANT HYPERTHERMIA.  PT STATES HE WAS TOLD THE SPINAL WAS HARD TO  PLACE-THAT HE HAD CALCIFICATIONS.  PT STATES HIS THIGH'S HAVE HAD SOME NUMBNESS SINCE THE SPINAL.MH has since been ruled out. patient has a hard  . Obese   .  Pneumonia   . Prostate cancer ([place])   . Sleep apnea    STOP BANG SCORE OF 5    Past Surgical History:  Procedure Laterality Date  . APPENDECTOMY  02-26-2010  . COLONOSCOPY    . INCISIONAL HERNIA REPAIR  01/15/2012   Procedure: LAPAROSCOPIC INCISIONAL HERNIA;  Surgeon: Harl Bowie, MD;  Location: [facility];  Service: General;  Laterality: N/A;  Laparoscopic Incisional Hernia Repair with Mesh  . PELVIC LYMPH NODE DISSECTION Bilateral 03/30/2018   Procedure: PELVIC LYMPH NODE DISSECTION;  Surgeon: Lucas Mallow, MD;  Location: [facility];  Service: Urology;  Laterality: Bilateral;  . ROBOT ASSISTED LAPAROSCOPIC RADICAL PROSTATECTOMY N/A 03/30/2018   Procedure: XI ROBOTIC ASSISTED LAPAROSCOPIC RADICAL PROSTATECTOMY;  Surgeon: Lucas Mallow, MD;  Location: [facility];  Service: Urology;  Laterality: N/A;    There were no vitals filed for this visit.  Subjective Assessment - 05/01/18 0934    Subjective  I had surgery from 03/30/2018 to 04/01/2018. I had the catheter out 9 days. I have been doing my walking. I have a little leakage with coughing and sneezing, sit long period of time and stand.    Patient is accompained by:  Family member   wife  Patient Stated Goals  reduce pain, improve leakage    Currently in Pain?  Yes    Pain Score  2     Pain Location  Perineum    Pain Orientation  Mid    Pain Descriptors / Indicators  Aching    Pain Type  Acute pain    Pain Onset  1 to 4 weeks ago    Pain Frequency  Intermittent    Aggravating Factors   sitting, driving    Pain Relieving Factors  stainding, lay down    Multiple Pain Sites  No         [facility] PT Assessment - 05/01/18 0001      Assessment   Medical Diagnosis  C61 Prostate Cancer    Referring Provider (PT)  Dr. Link Snuffer    Onset Date/Surgical Date  02/25/18   surgical 03/30/2018   Prior Therapy  none      Precautions   Precautions  Other (comment)    Precaution Comments  prostate cancer; no lifting greater than 10#       Restrictions   Weight Bearing Restrictions  No      Balance Screen   Has the patient fallen in the past 6 months  No    Has the patient had a decrease in activity level because of a fear of falling?   No    Is the patient reluctant to leave their home because of a fear of falling?   No      Home Film/video editor residence      Prior Function   Level of Independence  Independent    Vocation  Full time employment    Vocation Requirements  lifting, office job, standing,    return work 05/11/2018   Leisure  2 months ago was riding a bike      Charity fundraiser Status  Within Functional Limits for tasks assessed      Posture/Postural Control   Posture/Postural Control  No significant limitations      ROM / Strength   AROM / PROM / Strength  AROM;PROM;Strength      AROM   Lumbar Extension  full    Lumbar - Right Side Bend  full    Lumbar - Left Side Bend  full                Pelvic Floor Special Questions - 05/01/18 0001    Urinary Leakage  Yes    Pad use  pull up    Activities that cause leaking  Coughing;Sneezing   sit period of time to standing       [facility] Adult PT Treatment/Exercise - 05/01/18 0001      Self-Care   Self-Care  Other Self-Care Comments    Other Self-Care Comments   scar massage and perineal massage      Therapeutic Activites    Therapeutic Activities  ADL's    Lifting  sit to stand, squatting              PT Education - 05/01/18 1027    Education Details  pelvic floor exercises, scar massage, perineal massage; posture    Person(s) Educated  Patient    Methods  Explanation;Demonstration;Verbal cues;Handout    Comprehension  Returned demonstration;Verbalized understanding       PT Short Term Goals - 03/24/18 1659      PT SHORT TERM GOAL #1   Title  understand how  to contract the pelvic floor during different positions    Time  4    Period  Weeks    Status  Achieved      PT SHORT  TERM GOAL #2   Title  understand how to perform daily tasks and transitional movements without straining the pelvic floor    Time  4    Period  Weeks    Status  Achieved      PT SHORT TERM GOAL #3   Title  how to toilet correctly to not strain the pelvic floor after surgery    Time  4    Period  Weeks    Status  Achieved      PT SHORT TERM GOAL #4   Title  understand the importance of walking program for recovery after surgery    Time  4    Period  Weeks    Status  Achieved      PT SHORT TERM GOAL #5   Title  understand upright posture and how it assists in pelvic floor contraction    Time  4    Period  Weeks    Status  Achieved        PT Long Term Goals - 05/01/18 1022      PT LONG TERM GOAL #1   Title  after surgery, Patient is reassessed for new goals    Time  12    Period  Weeks    Status  Achieved      PT LONG TERM GOAL #2   Title  urinary leakage with coughing and sneezing decreased >/= 75% due to improved strength and coordination    Time  6    Period  Weeks    Status  New    Target Date  06/12/18      PT LONG TERM GOAL #3   Title  urinary leakage with sitting a period of time then standing reduced >/= 75% due to improved coordination     Time  6    Period  Weeks    Status  New    Target Date  06/12/18      PT LONG TERM GOAL #4   Title  perform work task like lifting correctly to reduce strain on the pelvic floor and urinary leakage    Time  6    Period  Weeks    Status  New    Target Date  06/12/18            Plan - 05/01/18 1014    Clinical Impression Statement  Patient is s/p prostectomy on 03/30/2018. He was in the hospital from 03/30/2018 to 04/01/2018. Patient had the catheter in for 9 days.  Perineal pain is 2/10 when sitting in a car or for long period of time. Patient has been consistent with his pre prostectomy HEP. Patient is scheduled to return to work on 05/11/2018 and see the doctor on 05/13/2018. Patient reports he will leak urine  when coughing, sneezing, and after sitting a period of time and going into standing. Patient reports he will urinate and only feel the intial urination and not feel that he is still going at the end.  Surgical sites are healing well with tightness. Patient still wears 1 depends during the day.  Patient has tightness in the diaphgram area. Patient job entails him to lift 100# on occasion, work on equipment, sit to do paper work, and walk around. Patient is nervous about the physical  work and if he will be strong enough for it. Patient will benefit from skilled therapy to reduce perineal pain, reduce urinary leakage, and abiltiy to perform his work tasks.     Rehab Potential  Excellent    Clinical Impairments Affecting Rehab Potential  Hernia repair 01/14/2018; Prostectomy 03/30/2018    PT Frequency  1x / week    PT Duration  6 weeks    PT Treatment/Interventions  Biofeedback;Therapeutic exercise;Therapeutic activities;Neuromuscular re-education;Patient/family education;Manual techniques;Moist Heat;Cryotherapy;Electrical Stimulation;Scar mobilization    PT Next Visit Plan  pelvic floor EMG with movements, continue with squats, one legged bridge, advanced abdominal strength    Recommended Other Services  sent MD renewal note    Consulted and Agree with Plan of Care  Patient;Family member/caregiver    Family Member Consulted  wife       Patient will benefit from skilled therapeutic intervention in order to improve the following deficits and impairments:  Decreased strength, Increased fascial restricitons, Decreased coordination, Decreased scar mobility  Visit Diagnosis: Muscle weakness (generalized) - Plan: PT plan of care cert/re-cert  Other lack of coordination - Plan: PT plan of care cert/re-cert     Problem List Patient Active Problem List   Diagnosis Date Noted  . Prostate cancer ([place]) 03/30/2018  . Hx of appendectomy 11/20/2011  . Incisional hernia 11/20/2011    Earlie Counts,  PT 05/01/18 10:29 AM   [facility] [address] [place], [place], [address] Phone: [phone]   Fax:  [phone]  Name: Edward Arroyo MRN: [id number] Date of Birth: Jul 21, 1960

## 2018-05-01 NOTE — Patient Instructions (Addendum)
Scar Massage  Scar massage is done to improve the mobility of scar, decrease scar tissue from building up, reduce adhesions, and prevent Keloids from forming. Start scar massage after scabs have fallen off by themselves and no open areas. The first few weeks after surgery, it is normal for a scar to appear pink or red and slightly raised. Scars can itch or have areas of numbness. Some scars may be sensitive.   Direct Scar massage: after scar is healed, no opening, no scab 1.  Place pads of two fingers together directly on the scar starting at one end of the scar. Move the fingers up and down across the scar holding 5 seconds one direction.  Then go opposite direction hold 5 seconds.  2. Move over to the next section of the scar and repeat.  Work your way along the entire length of the scar.   3. Next make diagonal movements along the scar holding 5 seconds at one direction. 4. Next movement is side to side. 5. Do not rub fingers over the scar.  Instead keep firm pressure and move scar over the tissue it is on top   Scar Lift and Roll 12 weeks after surgery. 1. Pinch a small amount of the scar between your first two fingers and thumb.  2. Roll the scar between your fingers for 5 to 15 seconds. 3. Move along the scar and repeat until you have massaged the entire length of scar.   Stop the massage and call your doctor if you notice: 1. Increased redness 2. Bleeding from scar 3. Seepage coming from the scar 4. Scar is warmer and has increased pain   Supine    Lie flat with pillow support. Allow body's muscles to relax. Place hands on belly. Inhale slowly and deeply for __3_ seconds, so hands move up. Then take _3__ seconds to exhale. Repeat _5__ times. Do __2_ times a day.   Copyright  VHI. All rights reserved.  Sitting    Sit comfortably. Allow body's muscles to relax. Place hands on belly. Inhale slowly and deeply for _3__ seconds, so hands move out. Then take _3__ seconds to  exhale. Repeat _5__ times. Do __2_ times a day.  Copyright  VHI. All rights reserved.  Slow Contraction: Gravity Eliminated (Hook-Lying)    Lie with hips and knees bent. Slowly squeeze pelvic floor for _20__ seconds. Rest for __10_ seconds. Repeat __5_ times. Do _2__ times a day. End off with 5 quick flicks  Copyright  VHI. All rights reserved.   Soft tissue work to the perineum area with circular massage  For 5 minutes  Slow Contraction: Gravity Resisted (Sitting)    Sitting, slowly squeeze pelvic floor for _10__ seconds. Rest for _5__ seconds. Repeat _5__ times. Do _4__ times a day. End off with 5 quick flicks Copyright  VHI. All rights reserved.  Cough: Phase 2 (Sitting)    Sit. Squeeze pelvic floor and hold. Inhale. Lean shoulders forward. Exhale. Relax. Repeat _5__ times. Do __3_ times a day.  Copyright  VHI. All rights reserved.  Squat    Standing, squeeze pelvic floor and hold. Squat. Relax. Repeat _10__ times. Do __2_ times a day. Go half way, make sure you are breathing, chin up and chest up.  Copyright  VHI. All rights reserved.  [facility] [address], [place], [place] [address] Phone # [phone] Fax [phone]

## 2018-05-06 ENCOUNTER — Ambulatory Visit: Payer: 59 | Admitting: Physical Therapy

## 2018-05-06 ENCOUNTER — Encounter: Payer: Self-pay | Admitting: Physical Therapy

## 2018-05-06 DIAGNOSIS — M6281 Muscle weakness (generalized): Secondary | ICD-10-CM

## 2018-05-06 DIAGNOSIS — R278 Other lack of coordination: Secondary | ICD-10-CM

## 2018-05-06 NOTE — Patient Instructions (Addendum)
Step Down (Sitting)    Sitting, tighten pelvic floor and Hold for __2_ seconds. Release contraction by half and hold for _2__ seconds. Relax. Repeat _5__ times. Do __1_ times a day. For stepping down then do 5 times stepping down  Copyright  VHI. All rights reserved.  Cough: Phase 4 (Sitting)    Squeeze pelvic floor and hold. Inhale. Lean shoulders forward. Cough repeatedly 5 times . Relax. Repeat __3_ times. Do __1_ times a day.  Copyright  VHI. All rights reserved.  Sit to Stand: Phase 3    When you stand up you contract the pelvic floor. When you sit you contract the pelvic floor.  Make sure you are breathing.  Copyright  VHI. All rights reserved.  Slow Contraction: Gravity Resisted (Standing)    Standing, slowly squeeze pelvic floor for __10_ seconds. Rest for _5_ seconds. Repeat _5__ times. Do _1__ times a day. Then do deep breaths with holding 10 seconds 5 times 1 time per day  Copyright  VHI. All rights reserved.   Bracing With Leg March (Hook-Lying)    With neutral spine, tighten pelvic floor and abdominals and hold. Alternating legs, lift foot _12__ inches and return to floor. Repeat _15__ times each leg Do __1_ times a day.   Copyright  VHI. All rights reserved.  Bracing With Bridging (Hook-Lying)    With neutral spine, tighten pelvic floor and abdominals and hold. Lift bottom. Repeat _20__ times. Do _1__ times a day. The lift hips and bring knees out and in. Bring hips down. Let go of pelvic floor contraction.  10 times  Copyright  VHI. All rights reserved.  Bracing With Arm Lift (Hook-Lying)    With neutral spine, tighten pelvic floor and abdominals and hold. Alternating arms, raise over head and return to side. Repeat _15__ times. Each side Do _1__ times a day.   Copyright  VHI. All rights reserved.  [facility] [address], [place], [place] [address] Phone # [phone] Fax [phone]

## 2018-05-06 NOTE — Therapy (Signed)
[facility] [address], [place], [place], [address] Phone: [phone]   Fax:  [phone]  Physical Therapy Treatment  Patient Details  Name: Edward Arroyo MRN: [id number] Date of Birth: 12/03/1960 Referring Provider (PT): Dr. Link Snuffer   Encounter Date: 05/06/2018  PT End of Session - 05/06/18 1223    Visit Number  4    Date for PT Re-Evaluation  06/12/18    Authorization Type  cigna    PT Start Time  1230    PT Stop Time  1316    PT Time Calculation (min)  46 min    Activity Tolerance  Patient tolerated treatment well    Behavior During Therapy  [facility] for tasks assessed/performed       Past Medical History:  Diagnosis Date  . Arthritis    PT STATES A LOT OF JOINT PAIN-SUSPECTS ARTHRITIS  . Chronic back pain   . Constipation    PT HOPING CONSTIPATION IS RELATED TO HERNIA - ON DAILY LAXATIVE, resolved  . Diabetes mellitus    "BORDERLINE"  PT NOT ON ANY MEDICATIONS  . Diverticulosis   . Family history of anesthesia complication    PT'S GRANDSON -POSSIBLE MALIGNANT HYPERTHERMIA--GRANDSON HAVING MUSCLE BIOPSY TODAY  01/10/12   . Fatty liver   . Ganglion cyst    left ankle  . GERD (gastroesophageal reflux disease)    RECENT PROBLEMS-NO MEDS, no longer an issue  . Groin pain, right   . Heart murmur    AS A CHILD  . Hemorrhoids   . Hypertension   . IBS (irritable bowel syndrome)    resolved  . Incisional hernia    S/P APPENDECTOMY  . Lung nodule    35mm Left Lower Lobe  . Malignant hyperthermia    SEE FAMILY HX OF GRANDSON POSSIBLE MH. PT HAD APPENDECTOMY SEPT 2011 AT [place] OF GRANDSON'S HISTORY OF "POSSIBLE" MALIGNANT HYPERTHERMIA.  PT STATES HE WAS TOLD THE SPINAL WAS HARD TO  PLACE-THAT HE HAD CALCIFICATIONS.  PT STATES HIS THIGH'S HAVE HAD SOME NUMBNESS SINCE THE SPINAL.MH has since been ruled out. patient has a hard  . Obese   .  Pneumonia   . Prostate cancer ([place])   . Sleep apnea    STOP BANG SCORE OF 5    Past Surgical History:  Procedure Laterality Date  . APPENDECTOMY  02-26-2010  . COLONOSCOPY    . INCISIONAL HERNIA REPAIR  01/15/2012   Procedure: LAPAROSCOPIC INCISIONAL HERNIA;  Surgeon: Harl Bowie, MD;  Location: [facility];  Service: General;  Laterality: N/A;  Laparoscopic Incisional Hernia Repair with Mesh  . PELVIC LYMPH NODE DISSECTION Bilateral 03/30/2018   Procedure: PELVIC LYMPH NODE DISSECTION;  Surgeon: Lucas Mallow, MD;  Location: [facility];  Service: Urology;  Laterality: Bilateral;  . ROBOT ASSISTED LAPAROSCOPIC RADICAL PROSTATECTOMY N/A 03/30/2018   Procedure: XI ROBOTIC ASSISTED LAPAROSCOPIC RADICAL PROSTATECTOMY;  Surgeon: Lucas Mallow, MD;  Location: [facility];  Service: Urology;  Laterality: N/A;    There were no vitals filed for this visit.  Subjective Assessment - 05/06/18 1230    Subjective  I had some soreness 2-3 days after last visit. The leakage is the same with standing and coughing. We called the MD about work and they have not called back yet. Less pain in the perineal area.  When my bladder is full I have pain.     Patient  is accompained by:  Family member   wife   Patient Stated Goals  reduce pain, improve leakage    Currently in Pain?  Yes    Pain Score  4     Pain Location  Bladder    Pain Orientation  Mid    Pain Descriptors / Indicators  Aching    Pain Type  Acute pain    Pain Onset  1 to 4 weeks ago    Pain Frequency  Intermittent    Aggravating Factors   when I have to urinate    Pain Relieving Factors  relief of urinating    Multiple Pain Sites  No                    Pelvic Floor Special Questions - 05/06/18 0001    Biofeedback  sitting resting tone 2 uv, quick contraction 22 uv, 10 second contraction 22 uv;     Biofeedback sensor type  Rectal   rectal    Biofeedback Activity  10 second hold;Quick contraction   steps, core stabilization  exercises, coughing       [facility] Adult PT Treatment/Exercise - 05/06/18 0001      Neuro Re-ed    Neuro Re-ed Details   Pelvic floor exercises with coughing, squatting, sit to stand, deep breaths while holding pelvic floor contraction      Exercises   Exercises  Lumbar      Lumbar Exercises: Supine   Clam  20 reps;1 second    Bent Knee Raise  10 reps;1 second   with pelvic floor contraction   Bridge  15 reps   with pelvic floor contraction     Manual Therapy   Manual Therapy  Myofascial release    Myofascial Release  over the bladder area to soften the tissue to reduce urgency and pain when the bladder hurts             PT Education - 05/06/18 1314    Education Details  pelvic floor exercises with sitting, coughing, standing, squatting, and core workout    Person(s) Educated  Patient    Methods  Explanation;Demonstration;Verbal cues;Handout    Comprehension  Returned demonstration;Verbalized understanding       PT Short Term Goals - 03/24/18 1659      PT SHORT TERM GOAL #1   Title  understand how to contract the pelvic floor during different positions    Time  4    Period  Weeks    Status  Achieved      PT SHORT TERM GOAL #2   Title  understand how to perform daily tasks and transitional movements without straining the pelvic floor    Time  4    Period  Weeks    Status  Achieved      PT SHORT TERM GOAL #3   Title  how to toilet correctly to not strain the pelvic floor after surgery    Time  4    Period  Weeks    Status  Achieved      PT SHORT TERM GOAL #4   Title  understand the importance of walking program for recovery after surgery    Time  4    Period  Weeks    Status  Achieved      PT SHORT TERM GOAL #5   Title  understand upright posture and how it assists in pelvic floor contraction    Time  4  Period  Weeks    Status  Achieved        PT Long Term Goals - 05/01/18 1022      PT LONG TERM GOAL #1   Title  after surgery, Patient is  reassessed for new goals    Time  12    Period  Weeks    Status  Achieved      PT LONG TERM GOAL #2   Title  urinary leakage with coughing and sneezing decreased >/= 75% due to improved strength and coordination    Time  6    Period  Weeks    Status  New    Target Date  06/12/18      PT LONG TERM GOAL #3   Title  urinary leakage with sitting a period of time then standing reduced >/= 75% due to improved coordination     Time  6    Period  Weeks    Status  New    Target Date  06/12/18      PT LONG TERM GOAL #4   Title  perform work task like lifting correctly to reduce strain on the pelvic floor and urinary leakage    Time  6    Period  Weeks    Status  New    Target Date  06/12/18            Plan - 05/06/18 1234    Clinical Impression Statement  Patient has less perineal pain since he has been perfroming soft tissue work to the perineum.  Patient has difficulth with holding a pelvic floor contraction in standing with deep breaths.  Patient is able to hold a pelvic floor contraction going from sit to stand when he is thinking about it.  Patient is able to hold a pelvic floor contraction in sitting at 22 uv and standing 15 uv. Patient is able to do a quick contraction at 22 uv. Patient surgical sites are sore. Patient has some core instabiity with his core exercises. Patient will benefit from skilled therapy to reduce perineal pain, reduce urinary leakage, and ability to perform his work tasks.     Rehab Potential  Excellent    Clinical Impairments Affecting Rehab Potential  Hernia repair 01/14/2018; Prostectomy 03/30/2018    PT Frequency  1x / week    PT Duration  6 weeks    PT Treatment/Interventions  Biofeedback;Therapeutic exercise;Therapeutic activities;Neuromuscular re-education;Patient/family education;Manual techniques;Moist Heat;Cryotherapy;Electrical Stimulation;Scar mobilization    PT Next Visit Plan  pelvic floor EMG with movements, continue with squats, one legged  bridge, advanced abdominal strength    PT Home Exercise Plan  --    Consulted and Agree with Plan of Care  Patient;Family member/caregiver    Family Member Consulted  wife       Patient will benefit from skilled therapeutic intervention in order to improve the following deficits and impairments:  Decreased strength, Increased fascial restricitons, Decreased coordination, Decreased scar mobility  Visit Diagnosis: Muscle weakness (generalized)  Other lack of coordination     Problem List Patient Active Problem List   Diagnosis Date Noted  . Prostate cancer ([place]) 03/30/2018  . Hx of appendectomy 11/20/2011  . Incisional hernia 11/20/2011    Earlie Counts, PT 05/06/18 2:07 PM   [facility] [address], [place], [place], [address] Phone: [phone]   Fax:  [phone]  Name: Edward Arroyo MRN: [id number] Date of Birth: 1961/01/11

## 2018-05-11 ENCOUNTER — Ambulatory Visit: Payer: 59 | Admitting: Physical Therapy

## 2018-05-11 ENCOUNTER — Encounter: Payer: Self-pay | Admitting: Physical Therapy

## 2018-05-11 DIAGNOSIS — M6281 Muscle weakness (generalized): Secondary | ICD-10-CM

## 2018-05-11 DIAGNOSIS — R278 Other lack of coordination: Secondary | ICD-10-CM

## 2018-05-11 NOTE — Patient Instructions (Signed)
Access Code: [facility]  URL: [email or web address]  Date: 05/11/2018  Prepared by: Earlie Counts   Exercises  Seated Shoulder Horizontal Abduction with Resistance - 10 reps - 1 sets - 2x daily - 7x weekly  Standing Shoulder Diagonal Horizontal Abduction 60/120 Degrees with Resistance - 10 reps - 1 sets - 2x daily - 7x weekly  Shoulder External Rotation and Scapular Retraction with Resistance - 10 reps - 2 sets - 1x daily - 7x weekly  Dead Bug - 10 reps - 3 sets - 1x daily - 7x weekly  Butterfly Bridge - 10 reps - 1 sets - 1x daily - 7x weekly  Squat with Chair Touch and Resistance Loop - 10 reps - 1 sets - 10 sec hold - 1x daily - 7x weekly  Mini Lunge - 10 reps - 1 sets - 5 sec hold - 1x daily - 7x weekly [facility] [address] [place], [place] [address] Phone # [phone] Fax [phone]

## 2018-05-11 NOTE — Therapy (Signed)
[facility] [address], [place], [place], [address] Phone: [phone]   Fax:  [phone]  Physical Therapy Treatment  Patient Details  Name: Edward Arroyo MRN: [id number] Date of Birth: 11/22/1960 Referring Provider (PT): Dr. Link Snuffer   Encounter Date: 05/11/2018  PT End of Session - 05/11/18 1700    Visit Number  5    Date for PT Re-Evaluation  06/12/18    Authorization Type  cigna    PT Start Time  1615    PT Stop Time  1655    PT Time Calculation (min)  40 min    Activity Tolerance  Patient tolerated treatment well    Behavior During Therapy  [facility] for tasks assessed/performed       Past Medical History:  Diagnosis Date  . Arthritis    PT STATES A LOT OF JOINT PAIN-SUSPECTS ARTHRITIS  . Chronic back pain   . Constipation    PT HOPING CONSTIPATION IS RELATED TO HERNIA - ON DAILY LAXATIVE, resolved  . Diabetes mellitus    "BORDERLINE"  PT NOT ON ANY MEDICATIONS  . Diverticulosis   . Family history of anesthesia complication    PT'S GRANDSON -POSSIBLE MALIGNANT HYPERTHERMIA--GRANDSON HAVING MUSCLE BIOPSY TODAY  01/10/12   . Fatty liver   . Ganglion cyst    left ankle  . GERD (gastroesophageal reflux disease)    RECENT PROBLEMS-NO MEDS, no longer an issue  . Groin pain, right   . Heart murmur    AS A CHILD  . Hemorrhoids   . Hypertension   . IBS (irritable bowel syndrome)    resolved  . Incisional hernia    S/P APPENDECTOMY  . Lung nodule    76mm Left Lower Lobe  . Malignant hyperthermia    SEE FAMILY HX OF GRANDSON POSSIBLE MH. PT HAD APPENDECTOMY SEPT 2011 AT [place] OF GRANDSON'S HISTORY OF "POSSIBLE" MALIGNANT HYPERTHERMIA.  PT STATES HE WAS TOLD THE SPINAL WAS HARD TO  PLACE-THAT HE HAD CALCIFICATIONS.  PT STATES HIS THIGH'S HAVE HAD SOME NUMBNESS SINCE THE SPINAL.MH has since been ruled out. patient has a hard  . Obese   .  Pneumonia   . Prostate cancer ([place])   . Sleep apnea    STOP BANG SCORE OF 5    Past Surgical History:  Procedure Laterality Date  . APPENDECTOMY  02-26-2010  . COLONOSCOPY    . INCISIONAL HERNIA REPAIR  01/15/2012   Procedure: LAPAROSCOPIC INCISIONAL HERNIA;  Surgeon: Harl Bowie, MD;  [facility]: [facility];  Service: General;  Laterality: N/A;  Laparoscopic Incisional Hernia Repair with Mesh  . PELVIC LYMPH NODE DISSECTION Bilateral 03/30/2018   Procedure: PELVIC LYMPH NODE DISSECTION;  Surgeon: Lucas Mallow, MD;  [facility]: [facility];  Service: Urology;  Laterality: Bilateral;  . ROBOT ASSISTED LAPAROSCOPIC RADICAL PROSTATECTOMY N/A 03/30/2018   Procedure: XI ROBOTIC ASSISTED LAPAROSCOPIC RADICAL PROSTATECTOMY;  Surgeon: Lucas Mallow, MD;  Location: [facility];  Service: Urology;  Laterality: N/A;    There were no vitals filed for this visit.  Subjective Assessment - 05/11/18 1619    Subjective  Today is my first day back at work. the leakage was a little worse. My urine is darker than normal. I am drinking 48 ounces of water and other beverages. I still feel discomfort with my bladder.  One night I got up 1 time.     Patient is  accompained by:  Family member   wife   Patient Stated Goals  reduce pain, improve leakage    Currently in Pain?  Yes    Pain Score  2     Pain Location  Bladder    Pain Orientation  Mid    Pain Descriptors / Indicators  Aching    Pain Type  Acute pain    Pain Onset  1 to 4 weeks ago    Pain Frequency  Intermittent    Aggravating Factors   burning with urination    Pain Relieving Factors  relift of urinating    Multiple Pain Sites  No                       [facility] Adult PT Treatment/Exercise - 05/11/18 0001      Exercises   Exercises  Lumbar      Lumbar Exercises: Standing   Functional Squats  10 reps;5 seconds   with pelvic floor contraction   Forward Lunge  20 reps;1 second   with pelvic floor contraction     Lumbar  Exercises: Supine   Clam  20 reps;1 second    Clam Limitations  with pelvic floor contraction    Dead Bug  20 reps;1 second   with pelvic floor contraction     Shoulder Exercises: Seated   Horizontal ABduction  Strengthening;Both;15 reps;Theraband    Theraband Level (Shoulder Horizontal ABduction)  Level 2 (Red)   tried green but too hard            PT Education - 05/11/18 1652    Education Details  Access Code: [email or web address] ; before getting out of the car to do 5 quick flicks to get the pelvic floor muscles contracting    Person(s) Educated  Spouse;Patient    Methods  Explanation;Demonstration;Handout    Comprehension  Returned demonstration;Verbalized understanding       PT Short Term Goals - 03/24/18 1659      PT SHORT TERM GOAL #1   Title  understand how to contract the pelvic floor during different positions    Time  4    Period  Weeks    Status  Achieved      PT SHORT TERM GOAL #2   Title  understand how to perform daily tasks and transitional movements without straining the pelvic floor    Time  4    Period  Weeks    Status  Achieved      PT SHORT TERM GOAL #3   Title  how to toilet correctly to not strain the pelvic floor after surgery    Time  4    Period  Weeks    Status  Achieved      PT SHORT TERM GOAL #4   Title  understand the importance of walking program for recovery after surgery    Time  4    Period  Weeks    Status  Achieved      PT SHORT TERM GOAL #5   Title  understand upright posture and how it assists in pelvic floor contraction    Time  4    Period  Weeks    Status  Achieved        PT Long Term Goals - 05/01/18 1022      PT LONG TERM GOAL #1   Title  after surgery, Patient is reassessed for new goals    Time  12  Period  Weeks    Status  Achieved      PT LONG TERM GOAL #2   Title  urinary leakage with coughing and sneezing decreased >/= 75% due to improved strength and coordination    Time  6    Period  Weeks    Status   New    Target Date  06/12/18      PT LONG TERM GOAL #3   Title  urinary leakage with sitting a period of time then standing reduced >/= 75% due to improved coordination     Time  6    Period  Weeks    Status  New    Target Date  06/12/18      PT LONG TERM GOAL #4   Title  perform work task like lifting correctly to reduce strain on the pelvic floor and urinary leakage    Time  6    Period  Weeks    Status  New    Target Date  06/12/18            Plan - 05/11/18 1701    Clinical Impression Statement  Patient is not having bladder pain. Patient feels like he is having a bladder infection. Patient is able to wake up 1-2 times per night to urinate. Patient will leak urine when he sits in a car for 45 minutes then gets up to get out of car. Patient is ready for challenging level of core strength due to increased in muscle control. Patient still needs verbal cues for increasing lumbar lordosis with squat to improve pelvic floor contraction. Patient is starting his first day of work. Patient will benefit from skilled therapy to reduce perineal pain, reduce urinary leakage, and ability to perform his work task.     Rehab Potential  Excellent    Clinical Impairments Affecting Rehab Potential  Hernia repair 01/14/2018; Prostectomy 03/30/2018    PT Frequency  1x / week    PT Duration  6 weeks    PT Treatment/Interventions  Biofeedback;Therapeutic exercise;Therapeutic activities;Neuromuscular re-education;Patient/family education;Manual techniques;Moist Heat;Cryotherapy;Electrical Stimulation;Scar mobilization    PT Next Visit Plan  pelvic floor EMG with movements, continue with squats, one legged bridge, advanced abdominal strength    PT Home Exercise Plan  Access Code: [email or web address]     Consulted and Agree with Plan of Care  Patient;Family member/caregiver    Family Member Consulted  wife       Patient will benefit from skilled therapeutic intervention in order to improve the following deficits  and impairments:  Decreased strength, Increased fascial restricitons, Decreased coordination, Decreased scar mobility  Visit Diagnosis: Muscle weakness (generalized)  Other lack of coordination     Problem List Patient Active Problem List   Diagnosis Date Noted  . Prostate cancer ([place]) 03/30/2018  . Hx of appendectomy 11/20/2011  . Incisional hernia 11/20/2011    Earlie Counts, PT 05/11/18 5:06 PM   [facility] [address], [place], [place], [address] Phone: [phone]   Fax:  [phone]  Name: Edward Arroyo MRN: [id number] Date of Birth: 09/26/1960

## 2018-05-26 ENCOUNTER — Encounter: Payer: Self-pay | Admitting: Physical Therapy

## 2018-05-26 ENCOUNTER — Ambulatory Visit: Payer: 59 | Attending: Urology | Admitting: Physical Therapy

## 2018-05-26 DIAGNOSIS — M6281 Muscle weakness (generalized): Secondary | ICD-10-CM | POA: Insufficient documentation

## 2018-05-26 DIAGNOSIS — R278 Other lack of coordination: Secondary | ICD-10-CM | POA: Insufficient documentation

## 2018-05-26 NOTE — Patient Instructions (Addendum)
Relaxation Exercises with the Urge to Void   When you experience an urge to void:  FIRST  Stop and stand very still    Sit down if you can    Don't move    You need to stay very still to maintain control  SECOND Squeeze your pelvic floor muscles 5 times, like a quick flick, to keep from leaking  THIRD Relax  Take a deep breath and then let it out  Try to make the urge go away by using relaxation and visualization techniques  FINALLY When you feel the urge go away somewhat, walk normally to the bathroom.   If the urge gets suddenly stronger on the way, you may stop again and relax to regain control. Access Code: [facility]  URL: [email or web address]  Date: 05/26/2018  Prepared by: Earlie Counts   Exercises  Seated Shoulder Horizontal Abduction with Resistance - 10 reps - 1 sets - 2x daily - 7x weekly  Standing Shoulder Diagonal Horizontal Abduction 60/120 Degrees with Resistance - 10 reps - 1 sets - 2x daily - 7x weekly  Shoulder External Rotation and Scapular Retraction with Resistance - 10 reps - 2 sets - 1x daily - 7x weekly  Dead Bug - 10 reps - 3 sets - 1x daily - 7x weekly  Butterfly Bridge - 10 reps - 1 sets - 1x daily - 7x weekly  Squat with Chair Touch and Resistance Loop - 10 reps - 1 sets - 10 sec hold - 1x daily - 7x weekly  Mini Lunge - 10 reps - 1 sets - 5 sec hold - 1x daily - 7x weekly  Curl Up with Reach - 10 reps - 1 sets - 1x daily - 7x weekly  Bird Dog - 10 reps - 2 sets - 1x daily - 7x weekly  Lunge with Counter Support - 10 reps - 1 sets - 1x daily - 7x weekly  [facility] [address], [place], [place] [address] Phone # [phone] Fax [phone]

## 2018-05-26 NOTE — Therapy (Signed)
[facility] [address] [place], [place], [address] Phone: [phone]   Fax:  [phone]  Physical Therapy Treatment  Patient Details  Name: Edward Arroyo MRN: [id number] Date of Birth: February 22, 1961 Referring Provider (PT): Dr. Link Snuffer   Encounter Date: 05/26/2018  PT End of Session - 05/26/18 1442    Visit Number  6    Date for PT Re-Evaluation  06/12/18    Authorization Type  cigna    PT Start Time  1400    PT Stop Time  1438    PT Time Calculation (min)  38 min    Activity Tolerance  Patient tolerated treatment well    Behavior During Therapy  [facility] for tasks assessed/performed       Past Medical History:  Diagnosis Date  . Arthritis    PT STATES A LOT OF JOINT PAIN-SUSPECTS ARTHRITIS  . Chronic back pain   . Constipation    PT HOPING CONSTIPATION IS RELATED TO HERNIA - ON DAILY LAXATIVE, resolved  . Diabetes mellitus    "BORDERLINE"  PT NOT ON ANY MEDICATIONS  . Diverticulosis   . Family history of anesthesia complication    PT'S GRANDSON -POSSIBLE MALIGNANT HYPERTHERMIA--GRANDSON HAVING MUSCLE BIOPSY TODAY  01/10/12   . Fatty liver   . Ganglion cyst    left ankle  . GERD (gastroesophageal reflux disease)    RECENT PROBLEMS-NO MEDS, no longer an issue  . Groin pain, right   . Heart murmur    AS A CHILD  . Hemorrhoids   . Hypertension   . IBS (irritable bowel syndrome)    resolved  . Incisional hernia    S/P APPENDECTOMY  . Lung nodule    82m Left Lower Lobe  . Malignant hyperthermia    SEE FAMILY HX OF GRANDSON POSSIBLE MH. [facility] OF "POSSIBLE" MALIGNANT HYPERTHERMIA.  PT STATES HE WAS TOLD THE SPINAL WAS HARD TO  PLACE-THAT HE HAD CALCIFICATIONS.  PT STATES HIS THIGH'S HAVE HAD SOME NUMBNESS SINCE THE SPINAL.MH has since been ruled out. patient has a hard  . Obese   .  Pneumonia   . [place]   . Sleep apnea    STOP BANG SCORE OF 5    Past Surgical History:  Procedure Laterality Date  . APPENDECTOMY  02-26-2010  . COLONOSCOPY    . INCISIONAL HERNIA REPAIR  01/15/2012   Procedure: LAPAROSCOPIC INCISIONAL HERNIA;  Surgeon: DHarl Bowie MD;  Location: [facility];  Service: General;  Laterality: N/A;  Laparoscopic Incisional Hernia Repair with Mesh  . PELVIC LYMPH NODE DISSECTION Bilateral 03/30/2018   Procedure: PELVIC LYMPH NODE DISSECTION;  Surgeon: BLucas Mallow MD;  Location: [facility];  Service: Urology;  Laterality: Bilateral;  . ROBOT ASSISTED LAPAROSCOPIC RADICAL PROSTATECTOMY N/A 03/30/2018   Procedure: XI ROBOTIC ASSISTED LAPAROSCOPIC RADICAL PROSTATECTOMY;  Surgeon: BLucas Mallow MD;  Location: [facility];  Service: Urology;  Laterality: N/A;    There were no vitals filed for this visit.  Subjective Assessment - 05/26/18 1404    Subjective  I am working and is going alright. I have 0 PSA. I have not had to adjust my work schedule. MD is happy with my progress. No UTI. Bladder ultrasound showed he fully empties the bladder.  Urinary leakage after sitting a long period of time. Better in the last few  days.  I am not wearing pads.     Patient is accompained by:  Family member   wife    Patient Stated Goals  reduce pain, improve leakage    Currently in Pain?  Yes    Pain Score  2     Pain Location  Perineum    Pain Orientation  Mid    Pain Descriptors / Indicators  Aching    Pain Type  Acute pain    Pain Onset  More than a month ago    Pain Frequency  Intermittent    Aggravating Factors   when go constipated    Pain Relieving Factors  no sure    Multiple Pain Sites  No         [facility] PT Assessment - 05/26/18 0001      Assessment   Medical Diagnosis  C61 Prostate Cancer    Referring Provider (PT)  Dr. Link Snuffer    Onset Date/Surgical Date  02/25/18   surgical 03/30/2018   Prior Therapy  none      Precautions    Precautions  Other (comment)    Precaution Comments  prostate cancer; no lifting greater than 10#      Restrictions   Weight Bearing Restrictions  No      [place] residence      Prior Function   Level of Independence  Independent    Vocation  Full time employment    Vocation Requirements  lifting, office job, standing,    return work 05/11/2018   Leisure  2 months ago was riding a bike      Charity fundraiser Status  Within Functional Limits for tasks assessed      Posture/Postural Control   Posture/Postural Control  No significant limitations      ROM / Strength   AROM / PROM / Strength  AROM;PROM;Strength      AROM   Lumbar Extension  full    Lumbar - Right Side Bend  full    Lumbar - Left Side Bend  full      Transfers   Transfers  Not assessed      Ambulation/Gait   Ambulation/Gait  No                   [facility] Adult PT Treatment/Exercise - 05/26/18 0001      Exercises   Exercises  Lumbar      Lumbar Exercises: Supine   Clam  20 reps;1 second    Clam Limitations  with pelvic floor contraction; band around knees    Dead Bug  20 reps;1 second   with pelvic floor contraction   Dead Bug Limitations  red band resistance with shoulder flexion    Bridge  15 reps;1 second   with clam and red band; pelvic floor contraction   Other Supine Lumbar Exercises  curl up with pelvic floor contraction      Lumbar Exercises: Quadruped   Opposite Arm/Leg Raise  Right arm/Left leg;Left arm/Right leg;10 reps;1 second   pelvic floor contraction            PT Education - 05/26/18 1440    Education Details  Access Code: [email or web address] ; urge to void    Person(s) Educated  Patient;Spouse   wife   Methods  Explanation;Demonstration;Verbal cues;Handout    Comprehension  Verbalized understanding;Returned demonstration       PT Short Term Goals - 03/24/18  [place] #1   Title  understand how to  contract the pelvic floor during different positions    Time  4    Period  Weeks    Status  Achieved      PT SHORT TERM GOAL #2   Title  understand how to perform daily tasks and transitional movements without straining the pelvic floor    Time  4    Period  Weeks    Status  Achieved      PT SHORT TERM GOAL #3   Title  how to toilet correctly to not strain the pelvic floor after surgery    Time  4    Period  Weeks    Status  Achieved      PT SHORT TERM GOAL #4   Title  understand the importance of walking program for recovery after surgery    Time  4    Period  Weeks    Status  Achieved      PT SHORT TERM GOAL #5   Title  understand upright posture and how it assists in pelvic floor contraction    Time  4    Period  Weeks    Status  Achieved        PT Long Term Goals - 05/26/18 1413      PT LONG TERM GOAL #2   Title  urinary leakage with coughing and sneezing decreased >/= 75% due to improved strength and coordination    Baseline  95% better    Time  6    Period  Weeks    Status  Achieved      PT LONG TERM GOAL #3   Title  urinary leakage with sitting a period of time then standing reduced >/= 75% due to improved coordination     Time  6    Period  Weeks    Status  Achieved      PT LONG TERM GOAL #4   Title  perform work task like lifting correctly to reduce strain on the pelvic floor and urinary leakage    Time  6    Period  Weeks    Status  Achieved            Plan - 05/26/18 1412    Clinical Impression Statement  Patient has met his goals. Patient only has leakage after getting up from a chair after sitting for a period of time. Patient is able to lift, cough, sneeze, and stand all day without leakage. Patient is very diligent with his HEP. Patient is ready for discharge.     Rehab Potential  Excellent    Clinical Impairments Affecting Rehab Potential  Hernia repair 01/14/2018; Prostectomy 03/30/2018    PT Frequency  --    PT Duration  --    PT  Treatment/Interventions  Biofeedback;Therapeutic exercise;Therapeutic activities;Neuromuscular re-education;Patient/family education;Manual techniques;Moist Heat;Cryotherapy;Electrical Stimulation;Scar mobilization    PT Next Visit Plan  Discharge to HEP this visit    PT Home Exercise Plan  Access Code: [facility]     Recommended Other Services  MD signed the renewal    Consulted and Agree with Plan of Care  Patient;Family member/caregiver    Family Member Consulted  wife       Patient will benefit from skilled therapeutic intervention in order to improve the following deficits and impairments:  Decreased strength, Increased fascial restricitons, Decreased coordination, Decreased scar mobility  Visit Diagnosis: Muscle  weakness (generalized)  Other lack of coordination     Problem List Patient Active Problem List   Diagnosis Date Noted  . Prostate cancer ([place]) 03/30/2018  . Hx of appendectomy 11/20/2011  . Incisional hernia 11/20/2011    Earlie Counts, PT 05/26/18 2:46 PM   [facility] [address] [place], [place], [address] Phone: [phone]   Fax:  [phone]  Name: Edward Arroyo MRN: [id number] Date of Birth: Oct 03, 1960 PHYSICAL THERAPY DISCHARGE SUMMARY  Visits from Start of Care: 6  Current functional level related to goals / functional outcomes: See above.    Remaining deficits: See above.    Education / Equipment: HEP Plan: Patient agrees to discharge.  Patient goals were met. Patient is being discharged due to meeting the stated rehab goals.  Thank you for the referral. Earlie Counts, PT 05/26/18 2:46 PM '?????

## 2018-06-03 ENCOUNTER — Encounter: Payer: 59 | Admitting: Physical Therapy

## 2019-06-03 ENCOUNTER — Other Ambulatory Visit: Payer: Self-pay | Admitting: Surgery

## 2019-06-03 DIAGNOSIS — Z8546 Personal history of malignant neoplasm of prostate: Secondary | ICD-10-CM

## 2019-06-03 DIAGNOSIS — R1031 Right lower quadrant pain: Secondary | ICD-10-CM

## 2019-06-08 ENCOUNTER — Ambulatory Visit
Admission: RE | Admit: 2019-06-08 | Discharge: 2019-06-08 | Disposition: A | Discharge status: Home / Self Care | Payer: 59 | Source: Ambulatory Visit | Attending: Surgery | Admitting: Surgery

## 2019-06-08 DIAGNOSIS — Z8546 Personal history of malignant neoplasm of prostate: Secondary | ICD-10-CM

## 2019-06-08 DIAGNOSIS — R1031 Right lower quadrant pain: Secondary | ICD-10-CM

## 2019-06-08 MED ORDER — IOPAMIDOL (ISOVUE-300) INJECTION 61%
125.0000 mL | Freq: Once | INTRAVENOUS | Status: AC | PRN
  Administered 2019-06-08: 125 mL via INTRAVENOUS

## 2021-09-10 ENCOUNTER — Emergency Department (HOSPITAL_COMMUNITY): Payer: 59

## 2021-09-10 ENCOUNTER — Other Ambulatory Visit: Payer: Self-pay

## 2021-09-10 ENCOUNTER — Emergency Department (HOSPITAL_COMMUNITY)
Admission: EM | Admit: 2021-09-10 | Discharge: 2021-09-11 | Disposition: A | Discharge status: Home / Self Care | Payer: 59 | Attending: Emergency Medicine | Admitting: Emergency Medicine

## 2021-09-10 DIAGNOSIS — M546 Pain in thoracic spine: Secondary | ICD-10-CM | POA: Diagnosis not present

## 2021-09-10 DIAGNOSIS — R079 Chest pain, unspecified: Secondary | ICD-10-CM | POA: Diagnosis present

## 2021-09-10 DIAGNOSIS — R911 Solitary pulmonary nodule: Secondary | ICD-10-CM | POA: Insufficient documentation

## 2021-09-10 DIAGNOSIS — I10 Essential (primary) hypertension: Secondary | ICD-10-CM | POA: Diagnosis not present

## 2021-09-10 DIAGNOSIS — Z8546 Personal history of malignant neoplasm of prostate: Secondary | ICD-10-CM | POA: Insufficient documentation

## 2021-09-10 DIAGNOSIS — R072 Precordial pain: Secondary | ICD-10-CM | POA: Diagnosis not present

## 2021-09-10 DIAGNOSIS — Z7982 Long term (current) use of aspirin: Secondary | ICD-10-CM | POA: Insufficient documentation

## 2021-09-10 LAB — COMPREHENSIVE METABOLIC PANEL
ALT: 39 U/L (ref 0–44)
AST: 31 U/L (ref 15–41)
Albumin: 4.4 g/dL (ref 3.5–5.0)
Alkaline Phosphatase: 58 U/L (ref 38–126)
Anion gap: 9 (ref 5–15)
BUN: 18 mg/dL (ref 6–20)
CO2: 27 mmol/L (ref 22–32)
Calcium: 10.1 mg/dL (ref 8.9–10.3)
Chloride: 101 mmol/L (ref 98–111)
Creatinine, Ser: 0.8 mg/dL (ref 0.61–1.24)
GFR, Estimated: >60 mL/min (ref >60)
Glucose, Bld: 222 mg/dL — ABNORMAL HIGH (ref 70–99)
Potassium: 3.6 mmol/L (ref 3.5–5.1)
Sodium: 137 mmol/L (ref 135–145)
Total Bilirubin: 0.8 mg/dL (ref 0.3–1.2)
Total Protein: 7.4 g/dL (ref 6.5–8.1)

## 2021-09-10 LAB — TROPONIN I (HIGH SENSITIVITY)
Troponin I (High Sensitivity): 9 ng/L (ref <18)
Troponin I (High Sensitivity): 9 ng/L (ref <18)

## 2021-09-10 LAB — LIPASE, BLOOD: Lipase: 31 U/L (ref 11–51)

## 2021-09-10 LAB — CBC
HCT: 46.6 % (ref 39.0–52.0)
Hemoglobin: 15.6 g/dL (ref 13.0–17.0)
MCH: 28.9 pg (ref 26.0–34.0)
MCHC: 33.5 g/dL (ref 30.0–36.0)
MCV: 86.5 fL (ref 80.0–100.0)
Platelets: 236 10*3/uL (ref 150–400)
RBC: 5.39 MIL/uL (ref 4.22–5.81)
RDW: 12.8 % (ref 11.5–15.5)
WBC: 7.6 10*3/uL (ref 4.0–10.5)
nRBC: 0 % (ref 0.0–0.2)

## 2021-09-10 NOTE — ED Provider Triage Note (Signed)
Emergency Medicine Provider Triage Evaluation Note ? ?Edward Arroyo , a 61 y.o. male  was evaluated in triage.  Pt complains of high blood pressure.  Also reports intermittent chest pain and this pain will radiate to his mid back.  He has not experienced pain like this before.  He is on olmesartan, has been on this medication for about 18 years and has worked well for him.  He has noticed that he has had to double his dose of antihypertensive more recently due to high blood pressures documented at home in the 389H systolic.  States that his blood pressure will improve with rest but as soon as he has any type of movement or activity will increase again.  He denies any leg swelling, but does endorse some shortness of breath.  Denies any cough, fever, abdominal pain or vomiting but pain is located in the middle of his lower chest. ? ?Review of Systems  ?Positive: Chest pain, shortness of breath ?Negative: Vomiting, fever ? ?Physical Exam  ?BP (!) 156/96 (BP Location: Right Arm)   Pulse 66   Temp 97.7 ?F (36.5 ?C) (Oral)   Resp 18   Ht '6\' 2"'$  (1.88 m)   Wt 120.2 kg   SpO2 96%   BMI 34.02 kg/m?  ?Gen:   Awake, no distress   ?Resp:  Normal effort ?MSK:   Moves extremities without difficulty  ?Other:  Tenderness palpation of the mid upper back ? ?Medical Decision Making  ?Medically screening exam initiated at 11:59 AM.  Appropriate orders placed.  Edward Arroyo was informed that the remainder of the evaluation will be completed by another provider, this initial triage assessment does not replace that evaluation, and the importance of remaining in the ED until their evaluation is complete. ? ?Labs and EKG ordered ?  ?Delia Heady, PA-C ?09/10/21 1200 ? ?

## 2021-09-10 NOTE — ED Triage Notes (Signed)
Pt with chest pain radiating to shoulders and through to back with some associated sob. Also reports hypertension despite doubling dose of anti hypertensives.  ?

## 2021-09-11 ENCOUNTER — Emergency Department (HOSPITAL_COMMUNITY): Payer: 59

## 2021-09-11 MED ORDER — IOHEXOL 350 MG/ML SOLN
100.0000 mL | Freq: Once | INTRAVENOUS | Status: AC | PRN
Start: 2021-09-11 — End: 2021-09-11
  Administered 2021-09-11: 100 mL via INTRAVENOUS

## 2021-09-11 MED ORDER — CYCLOBENZAPRINE HCL 10 MG PO TABS: 10.0000 mg | ORAL_TABLET | Freq: Two times a day (BID) | ORAL | 0 refills | Status: DC | PRN

## 2021-09-11 NOTE — ED Provider Notes (Signed)
?[facility] ?Provider Note ? ? ?CSN: [id number] ?Arrival date & time: 09/10/21  1131 ? ?  ? ?History ? ?Chief Complaint  ?Patient presents with  ? Chest Pain  ? Hypertension  ? ? ?Edward Arroyo is a 61 y.o. male. ? ?The history is provided by the patient, the spouse and medical records.  ?Chest Pain ?Hypertension ?Associated symptoms include chest pain.  ?Edward Arroyo is a 61 y.o. male who presents to the Emergency Department complaining of chest pain.  He presents to the ED accompanied by his wife for evaluation of chest pain that started today.  He developed constant midline sharp thoracic back pain about four days ago and today the pain was in his chest as well, radiates to bilateral shoulders.  Feels like he needs to take a deep breath but no sob.  No fever, N/V, abdominal pain, leg swelling or pain.   ? ?Has a hx/o HTN and is on benicar hct 0.'5mg'$  daily.  He sometimes take bid if he BP has been running high.  He noticed his BP has been high that last few days.   ? ?Remote hx/o prostate cancer, in remission.  ? ?No hx/o dvt/pe.  Family hx/o CAD in two brothers in 72s.  No tobacco.   ?  ? ?Home Medications ?Prior to Admission medications   ?Medication Sig Start Date End Date Taking? Authorizing Provider  ?aspirin EC 81 MG tablet Take 81 mg by mouth See admin instructions. Tuesday and thursday   Yes [provider]  ?cholecalciferol (VITAMIN D) 25 MCG (1000 UNIT) tablet Take 1,000 Units by mouth daily.   Yes [provider]  ?cyclobenzaprine (FLEXERIL) 10 MG tablet Take 1 tablet (10 mg total) by mouth 2 (two) times daily as needed for muscle spasms. 09/11/21  Yes Quintella Reichert, MD  ?ELDERBERRY PO Take 1 capsule by mouth daily.   Yes [provider]  ?LUTEIN PO Take 1 tablet by mouth daily.   Yes [provider]  ?olmesartan-hydrochlorothiazide (BENICAR HCT) 40-25 MG tablet Take 0.5 tablets by mouth daily.   Yes [provider]  ?vitamin C (ASCORBIC ACID) 500 MG tablet Take 500 mg by mouth daily.   Yes [provider]  ?zinc gluconate 50 MG tablet Take 50 mg by mouth daily.   Yes [provider]  ?polyethylene glycol (MIRALAX / GLYCOLAX) packet Take 17 g by mouth daily as needed for mild constipation.  ?Patient not taking: Reported on 09/11/2021    [provider]  ?senna-docusate (SENOKOT-S) 8.6-50 MG tablet Take 1 tablet by mouth daily. ?Patient not taking: Reported on 09/11/2021 03/31/18   Fredricka Bonine, MD  ?   ? ?Allergies    ?Patient has no known allergies.   ? ?Review of Systems   ?Review of Systems  ?Cardiovascular:  Positive for chest pain.  ?All other systems reviewed and are negative. ? ?Physical Exam ?Updated Vital Signs ?BP 128/64 (BP Location: Left Arm)   Pulse (!) 55   Temp 99 ?F (37.2 ?C) (Oral)   Resp 16   Ht '6\' 2"'$  (1.88 m)   Wt 120.2 kg   SpO2 97%   BMI 34.02 kg/m?  ?Physical Exam ?Vitals and nursing note reviewed.  ?Constitutional:   ?   Appearance: He is well-developed.  ?HENT:  ?   Head: Normocephalic and atraumatic.  ?Cardiovascular:  ?   Rate and Rhythm: Normal rate and regular rhythm.  ?   Heart sounds:  No murmur heard. ?Pulmonary:  ?   Effort: Pulmonary effort is normal. No respiratory distress.  ?   Breath sounds: Normal breath sounds.  ?Abdominal:  ?   Palpations: Abdomen is soft.  ?   Tenderness: There is no abdominal tenderness. There is no guarding or rebound.  ?Musculoskeletal:     ?   General: No tenderness.  ?   Comments: 2+ radial and DP pulses bilaterally.    ?Skin: ?   General: Skin is warm and dry.  ?Neurological:  ?   Mental Status: He is alert and oriented to person, place, and time.  ?   Comments: 5/5 strength in all four extremities with sensation to light touch intact in all four extremities.    ?Psychiatric:     ?   Behavior: Behavior normal.  ? ? ?ED Results / Procedures / Treatments   ?Labs ?(all labs ordered are listed, but only abnormal results  are displayed) ?Labs Reviewed  ?COMPREHENSIVE METABOLIC PANEL - Abnormal; Notable for the following components:  ?    Result Value  ? Glucose, Bld 222 (*)   ? All other components within normal limits  ?CBC  ?LIPASE, BLOOD  ?TROPONIN I (HIGH SENSITIVITY)  ?TROPONIN I (HIGH SENSITIVITY)  ? ? ?EKG ?EKG Interpretation ? ?Date/Time:  Monday September 10 2021 11:49:14 EDT ?Ventricular Rate:  73 ?PR Interval:  80 ?QRS Duration: 92 ?QT Interval:  420 ?QTC Calculation: 462 ?R Axis:   -30 ?Text Interpretation: Sinus rhythm with short PR Left axis deviation Inferior infarct , age undetermined Abnormal ECG increased rate compared to prior, otherwise no significant change since last tracing Confirmed by Quintella Reichert [phone]) on 09/11/2021 12:10:13 AM ? ?Radiology ?DG Chest 2 View ? ?Result Date: 09/10/2021 ?CLINICAL DATA:  Chest pain EXAM: CHEST - 2 VIEW COMPARISON:  07/16/2016 FINDINGS: The heart size and mediastinal contours are within normal limits. Both lungs are clear. The visualized skeletal structures are unremarkable. IMPRESSION: No active cardiopulmonary disease. Electronically Signed   By: Elmer Picker M.D.   On: 09/10/2021 12:19  ? ?CT Angio Chest/Abd/Pel for Dissection W and/or W/WO ? ?Result Date: 09/11/2021 ?CLINICAL DATA:  Suspected acute aortic syndrome. EXAM: CT ANGIOGRAPHY CHEST, ABDOMEN AND PELVIS TECHNIQUE: Non-contrast CT of the chest was initially obtained. Multidetector CT imaging through the chest, abdomen and pelvis was performed using the standard protocol during bolus administration of intravenous contrast. Multiplanar reconstructed images and MIPs were obtained and reviewed to evaluate the vascular anatomy. RADIATION DOSE REDUCTION: This exam was performed according to the departmental dose-optimization program which includes automated exposure control, adjustment of the mA and/or kV according to patient size and/or use of iterative reconstruction technique. CONTRAST:  174m OMNIPAQUE IOHEXOL 350  MG/ML SOLN COMPARISON:  PA and lateral chest yesterday, abdomen and pelvis CT with contrast 06/08/2019. No prior chest CT for comparison. FINDINGS: CTA CHEST FINDINGS Cardiovascular: The cardiac size is normal. There is no pericardial effusion. There is no pulmonary arterial dilatation, central embolus or evidence of acute right heart strain. No pericardial fluid is seen. There is trace atherosclerosis in the aortic arch. The aortic root is ectatic at 3.7 cm, the ascending segment borderline aneurysmal measuring 3.9 cm, remainder is within normal caliber limits with mild descending tortuosity. There is no dissection. The great vessels are patent. There is a nonstenosing calcification in the proximal right subclavian artery. Mediastinum/Nodes: There is no intrathoracic or axillary adenopathy. There is an 8 mm low-density nodule in the left lobe of the thyroid gland  with otherwise unremarkable thyroid. The trachea is midline and clear. There is no esophageal thickening. No supraclavicular mass. The chest wall is unremarkable. Lungs/Pleura: Stable 4.7 mm noncalcified subpleural nodule, lateral left lower lobe base on series 8 axial 81. There is a pleural-based 7.3 x 4 mm noncalcified nodule in the posterior right apex. Lungs are clear of infiltrates and no further nodules are seen. Central airways are clear. There is no pleural effusion, thickening or pneumothorax. Musculoskeletal: Thoracic spine demonstrates degenerative disc disease and spondylosis, but no destructive bone lesion. 9 mm osteochondral loose body noted in the subcoracoid bursal space. There is bilateral mild shoulder DJD. Review of the MIP images confirms the above findings. CTA ABDOMEN AND PELVIS FINDINGS VASCULAR Aorta: Normal caliber aorta without aneurysm, dissection, vasculitis or significant stenosis. There is mild scattered atherosclerosis, small amount. Celiac: Patent without evidence of aneurysm, dissection, vasculitis or significant stenosis.  SMA: Patent without evidence of aneurysm, dissection, vasculitis or significant stenosis. Renals: Both renal arteries are patent without evidence of aneurysm, dissection, vasculitis, fibromuscular dysplasia or si

## 2021-09-11 NOTE — ED Notes (Signed)
Patient verbalizes understanding of d/c instructions. Opportunities for questions and answers were provided. Pt d/c from ED and ambulated to lobby with wife.  

## 2021-09-11 NOTE — ED Notes (Signed)
Patient transported to CT 

## 2021-09-11 NOTE — Discharge Instructions (Addendum)
Start taking your blood pressure medication 1 tablet daily.  You had a CT scan today that showed multiple incidental findings that will need to be followed up by your family doctor.  You have a pulmonary nodule on your lung CT. You will need to have a repeat CT scan in the next 6 months to re-evaluate this.   ? ?CLINICAL DATA:  Suspected acute aortic syndrome. ?  ?EXAM: ?CT ANGIOGRAPHY CHEST, ABDOMEN AND PELVIS ?  ?TECHNIQUE: ?Non-contrast CT of the chest was initially obtained. ?  ?Multidetector CT imaging through the chest, abdomen and pelvis was ?performed using the standard protocol during bolus administration of ?intravenous contrast. Multiplanar reconstructed images and MIPs were ?obtained and reviewed to evaluate the vascular anatomy. ?  ?RADIATION DOSE REDUCTION: This exam was performed according to the ?departmental dose-optimization program which includes automated ?exposure control, adjustment of the mA and/or kV according to ?patient size and/or use of iterative reconstruction technique. ?  ?CONTRAST:  111m OMNIPAQUE IOHEXOL 350 MG/ML SOLN ?  ?COMPARISON:  PA and lateral chest yesterday, abdomen and pelvis CT ?with contrast 06/08/2019. No prior chest CT for comparison. ?  ?FINDINGS: ?CTA CHEST FINDINGS ?  ?Cardiovascular: The cardiac size is normal. There is no pericardial ?effusion. There is no pulmonary arterial dilatation, central embolus ?or evidence of acute right heart strain. ?  ?No pericardial fluid is seen. There is trace atherosclerosis in the ?aortic arch. The aortic root is ectatic at 3.7 cm, the ascending ?segment borderline aneurysmal measuring 3.9 cm, remainder is within ?normal caliber limits with mild descending tortuosity. There is no ?dissection. The great vessels are patent. There is a nonstenosing ?calcification in the proximal right subclavian artery. ?  ?Mediastinum/Nodes: There is no intrathoracic or axillary adenopathy. ?There is an 8 mm low-density nodule in the left lobe of  the thyroid ?gland with otherwise unremarkable thyroid. The trachea is midline ?and clear. There is no esophageal thickening. No supraclavicular ?mass. The chest wall is unremarkable. ?  ?Lungs/Pleura: Stable 4.7 mm noncalcified subpleural nodule, lateral ?left lower lobe base on series 8 axial 81. There is a pleural-based ?7.3 x 4 mm noncalcified nodule in the posterior right apex. ?  ?Lungs are clear of infiltrates and no further nodules are seen. ?Central airways are clear. There is no pleural effusion, thickening ?or pneumothorax. ?  ?Musculoskeletal: Thoracic spine demonstrates degenerative disc ?disease and spondylosis, but no destructive bone lesion. 9 mm ?osteochondral loose body noted in the subcoracoid bursal space. ?There is bilateral mild shoulder DJD. ?  ?Review of the MIP images confirms the above findings. ?  ?CTA ABDOMEN AND PELVIS FINDINGS ?  ?VASCULAR ?  ?Aorta: Normal caliber aorta without aneurysm, dissection, vasculitis ?or significant stenosis. There is mild scattered atherosclerosis, ?small amount. ?  ?Celiac: Patent without evidence of aneurysm, dissection, vasculitis ?or significant stenosis. ?  ?SMA: Patent without evidence of aneurysm, dissection, vasculitis or ?significant stenosis. ?  ?Renals: Both renal arteries are patent without evidence of aneurysm, ?dissection, vasculitis, fibromuscular dysplasia or significant ?stenosis. ?  ?IMA: Patent without evidence of aneurysm, dissection, vasculitis or ?significant stenosis. ?  ?Inflow: Patent without evidence of aneurysm, dissection, vasculitis ?or significant stenosis. There is trace calcification of the right ?internal iliac artery origin, minimal scattered calcific plaque in ?the left internal iliac artery. No visible external iliac artery ?calcifications. ?  ?Veins: No obvious venous abnormality within the limitations of this ?arterial phase study. ?  ?Review of the MIP images confirms the above findings. ?  ?NON-VASCULAR ?   ?  Hepatobiliary: 19 cm length liver which is moderately steatotic with ?increased steatosis compared to prior study. There is a 5 mm ?chronic, likely cyst in segment 4A but no suspicious enhancement. ?The gallbladder and bile ducts are unremarkable. ?  ?Pancreas: Unremarkable. ?  ?Spleen: Normal in size and enhancement. ?  ?Adrenals/Urinary Tract: There is no adrenal mass. There is a small ?parapelvic left renal cyst. No cortical mass, calculus or ?hydronephrosis seen. Bladder is unremarkable. ?  ?Stomach/Bowel: No dilatation or wall thickening. The appendix ?surgically absent, as before. There are diverticula, most numerous ?in the sigmoid segment without evidence of acute diverticulitis. ?Constipation ascending and proximal transverse colon. ?  ?Lymphatic: No lymphadenopathy is seen in the abdomen or the pelvis. ?  ?Reproductive: There is no prostatomegaly. The patient is status post ?prostatectomy. ?  ?Other: There is no incarcerated hernia. There are small inguinal fat ?hernias. There is no free air, hemorrhage or fluid. ?  ?Musculoskeletal: There is osteopenia with multilevel degenerative ?change in lumbar spine. Left pubic bone island is chronically noted. ?There is severe acquired spinal canal stenosis at L4-5, due to disc ?osteophyte complex, ligamentous and facet hypertrophy. ?  ?Review of the MIP images confirms the above findings. ?  ?IMPRESSION: ?1. No acute CT or CTA abnormality. ?2. Minimal aortic atherosclerosis, with borderline aneurysmal ?ascending segment 3.9 cm. No dissection or penetrating ulcer. ?3. 7.3 x 4 mm left upper lobe apical noncalcified nodule. Six-month ?follow-up CT recommended. ?4. 8 mm nodule left lobe of thyroid. No imaging follow-up is ?recommended. ?5. Moderate hepatic steatosis, with 4 mm chronic hypodensity most ?likely a cyst in the left lobe. ?6. Constipation and diverticulosis. ?7. Prior appendectomy and prostatectomy. ?8. Severe acquired spinal stenosis L4-5.  Osteopenia. ?   ?  ?Electronically Signed ?  By: Telford Nab M.D. ?  On: 09/11/2021 02:19 ?

## 2021-11-01 ENCOUNTER — Ambulatory Visit: Payer: 59 | Admitting: Cardiology

## 2021-11-21 ENCOUNTER — Encounter: Payer: Self-pay | Admitting: Cardiology

## 2021-11-21 NOTE — Progress Notes (Unsigned)
Cardiology Office Note  Date: 11/22/2021   ID: Edward Arroyo, DOB 07-May-1961, MRN [id number]  PCP:  Edward Arroyo  Cardiologist:  Edward Lesches, MD Electrophysiologist:  None   Chief Complaint  Patient presents with   History of chest pain    History of Present Illness: Edward Arroyo is a 61 y.o. male referred for cardiology consultation by Dr. Ralene Arroyo after Edward Arroyo ER visit in March with chest discomfort.  I reviewed the available records including lab work, ECG and imaging studies.  He is here today with his wife.  He tells me that within the last 6 months he has had trouble with insomnia,, also blood pressure elevations, feeling of fatigue in his chest when he wakes up in the morning.  He went to the ER in March mainly due to elevated blood pressure and back pain.  He does not describe any reproducible chest discomfort.  In the last few weeks he has felt better overall including with his sleep.  He has talked with his PCP about possible sleep apnea evaluation.  Reports compliance with therapy and has been taking Benicar HCT 40/25 mg daily since March.  Home blood pressure trend is better.  He does have a family history of premature CAD and 2 brothers, both with stents in their 47s.  He has a personal history of borderline diabetes mellitus, also hypertension.  He has not undergone any recent ischemic testing.  Past Medical History:  Diagnosis Date   Arthritis    Borderline diabetes    Chronic back pain    Diverticulosis    Family history of malignant hyperthermia    Possibly in grandson   Fatty liver    Ganglion cyst    Left ankle   GERD (gastroesophageal reflux disease)    Hemorrhoids    Hypertension    IBS (irritable bowel syndrome)    Incisional hernia    Following appendectomy   Lung nodule    Obese    Pneumonia    Prostate cancer Edward Arroyo)    Sleep apnea     Past Surgical History:  Procedure Laterality Date    APPENDECTOMY  02-26-2010   COLONOSCOPY     INCISIONAL HERNIA REPAIR  01/15/2012   Procedure: LAPAROSCOPIC INCISIONAL HERNIA;  Surgeon: Edward Bowie, MD;  Location: [facility];  Service: General;  Laterality: N/A;  Laparoscopic Incisional Hernia Repair with Mesh   PELVIC LYMPH NODE DISSECTION Bilateral 03/30/2018   Procedure: PELVIC LYMPH NODE DISSECTION;  Surgeon: Edward Mallow, MD;  Location: [facility];  Service: Urology;  Laterality: Bilateral;   ROBOT ASSISTED LAPAROSCOPIC RADICAL PROSTATECTOMY N/A 03/30/2018   Procedure: XI ROBOTIC ASSISTED LAPAROSCOPIC RADICAL PROSTATECTOMY;  Surgeon: Edward Mallow, MD;  Location: [facility];  Service: Urology;  Laterality: N/A;    Current Outpatient Medications  Medication Sig Dispense Refill   aspirin EC 81 MG tablet Take 81 mg by mouth See admin instructions. Tuesday and thursday     cholecalciferol (VITAMIN D) 25 MCG (1000 UNIT) tablet Take 1,000 Units by mouth daily.     cyclobenzaprine (FLEXERIL) 10 MG tablet Take 1 tablet (10 mg total) by mouth 2 (two) times daily as needed for muscle spasms. 14 tablet 0   ELDERBERRY PO Take 1 capsule by mouth daily.     LUTEIN PO Take 1 tablet by mouth daily.     MAGNESIUM GLUCONATE PO Take by mouth. 1 tab every morning & 2 tabs every evening  olmesartan-hydrochlorothiazide (BENICAR HCT) 40-25 MG tablet Take 1 tablet by mouth daily.     vitamin C (ASCORBIC ACID) 500 MG tablet Take 500 mg by mouth daily.     zinc gluconate 50 MG tablet Take 50 mg by mouth daily.     No current facility-administered medications for this visit.   Allergies:  Patient has no known allergies.   Social History: The patient  reports that he has never smoked. He has never used smokeless tobacco. He reports that he does not drink alcohol and does not use drugs.   Family History: The patient's family history includes CAD in his brother and brother; Lung cancer in his father and mother; Prostate cancer in his father.   ROS: No  palpitations or syncope.  Physical Exam: VS:  BP (!) 148/86   Pulse (!) 56   Ht '6\' 2"'$  (1.88 m)   Wt 275 lb 12.8 oz (125.1 kg)   SpO2 97%   BMI 35.41 kg/m , BMI Body mass index is 35.41 kg/m.  Wt Readings from Last 3 Encounters:  11/22/21 275 lb 12.8 oz (125.1 kg)  09/10/21 265 lb (120.2 kg)  03/30/18 250 lb (113.4 kg)    General: Patient appears comfortable Arroyo rest. HEENT: Conjunctiva and lids normal. Neck: Supple, no elevated JVP or carotid bruits, no thyromegaly. Lungs: Clear to auscultation, nonlabored breathing Arroyo rest. Cardiac: Regular rate and rhythm, no S3, 2/6 basal systolic murmur, no pericardial rub. Abdomen: Soft, nontender, bowel sounds present. Extremities: No pitting edema, distal pulses 2+. Skin: Warm and dry. Musculoskeletal: No kyphosis. Neuropsychiatric: Alert and oriented x3, affect grossly appropriate.  ECG:  An ECG dated 09/10/2021 was personally reviewed today and demonstrated:  Sinus rhythm with leftward axis and nonspecific ST changes.  Recent Labwork: 09/10/2021: ALT 39; AST 31; BUN 18; Creatinine, Ser 0.80; Hemoglobin 15.6; Platelets 236; Potassium 3.6; Sodium 137   Other Studies Reviewed Today:  Chest CTA 09/11/2021: IMPRESSION: 1. No acute CT or CTA abnormality. 2. Minimal aortic atherosclerosis, with borderline aneurysmal ascending segment 3.9 cm. No dissection or penetrating ulcer. 3. 7.3 x 4 mm left upper lobe apical noncalcified nodule. Six-month follow-up CT recommended. 4. 8 mm nodule left lobe of thyroid. No imaging follow-up is recommended. 5. Moderate hepatic steatosis, with 4 mm chronic hypodensity most likely a cyst in the left lobe. 6. Constipation and diverticulosis. 7. Prior appendectomy and prostatectomy. 8. Severe acquired spinal stenosis L4-5.  Osteopenia.  Assessment and Plan:  1.  History of chest discomfort in a 61 year old male with family history of premature CAD in 2 brothers, personal history of borderline diabetes  and also essential hypertension.  He did have minimal aortic atherosclerosis by CT imaging with borderline enlargement of the ascending aorta Arroyo 3.9 cm.  ECG nonspecific and high-sensitivity troponin I levels normal Arroyo ER visit in March.  We discussed proceeding with ischemic testing via exercise Myoview.  We will get an echocardiogram as well to assess cardiac structure and function.  2.  Essential hypertension, currently on Benicar HCT.  Discussed walking plan for exercise.  Would also go ahead with sleep apnea evaluation per PCP discussion.  Medication Adjustments/Labs and Tests Ordered: Current medicines are reviewed Arroyo length with the patient today.  Concerns regarding medicines are outlined above.   Tests Ordered: Orders Placed This Encounter  Procedures   NM Myocar Multi W/Spect W/Wall Motion / EF   ECHOCARDIOGRAM COMPLETE    Medication Changes: No orders of the defined types were placed in this encounter.  Disposition:  Follow up  test results.  Signed, Satira Sark, MD, [facility] 11/22/2021 9:31 AM    [address], [place], [place] [address] Phone: [phone]; Fax: [phone]

## 2021-11-22 ENCOUNTER — Encounter: Payer: Self-pay | Admitting: Cardiology

## 2021-11-22 ENCOUNTER — Telehealth: Payer: Self-pay | Admitting: Cardiology

## 2021-11-22 ENCOUNTER — Ambulatory Visit (INDEPENDENT_AMBULATORY_CARE_PROVIDER_SITE_OTHER): Payer: 59 | Admitting: Cardiology

## 2021-11-22 VITALS — BP 148/86 | HR 56 | Ht 74.0 in | Wt 275.8 lb

## 2021-11-22 DIAGNOSIS — Z8249 Family history of ischemic heart disease and other diseases of the circulatory system: Secondary | ICD-10-CM | POA: Diagnosis not present

## 2021-11-22 DIAGNOSIS — R011 Cardiac murmur, unspecified: Secondary | ICD-10-CM

## 2021-11-22 DIAGNOSIS — Z87898 Personal history of other specified conditions: Secondary | ICD-10-CM

## 2021-11-22 DIAGNOSIS — I10 Essential (primary) hypertension: Secondary | ICD-10-CM | POA: Diagnosis not present

## 2021-11-22 NOTE — Patient Instructions (Signed)
Medication Instructions:  Your physician recommends that you continue on your current medications as directed. Please refer to the Current Medication list given to you today.   Labwork: none  Testing/Procedures: Your physician has requested that you have an echocardiogram. Echocardiography is a painless test that uses sound waves to create images of your heart. It provides your doctor with information about the size and shape of your heart and how well your heart's chambers and valves are working. This procedure takes approximately one hour. There are no restrictions for this procedure.  Your physician has requested that you have en exercise stress myoview. For further information please visit [email or web address]. Please follow instruction sheet, as given.   Follow-Up: Your physician recommends that you schedule a follow-up appointment in: Follow Up Pending  Any Other Special Instructions Will Be Listed Below (If Applicable).  If you need a refill on your cardiac medications before your next appointment, please call your pharmacy.

## 2021-11-22 NOTE — Telephone Encounter (Signed)
Checking percert on the following patient for testing scheduled at [facility].     EXERCISE STRESS & ECHO  12/21/2021

## 2021-11-23 ENCOUNTER — Encounter: Payer: Self-pay | Admitting: Cardiology

## 2021-11-23 ENCOUNTER — Ambulatory Visit (INDEPENDENT_AMBULATORY_CARE_PROVIDER_SITE_OTHER): Payer: 59

## 2021-11-23 DIAGNOSIS — R011 Cardiac murmur, unspecified: Secondary | ICD-10-CM | POA: Diagnosis not present

## 2021-11-24 LAB — ECHOCARDIOGRAM COMPLETE
AR max vel: 3.48 cm2
AV Area VTI: 3.92 cm2
AV Area mean vel: 3.98 cm2
AV Mean grad: 5.7 mmHg
AV Peak grad: 14 mmHg
Ao pk vel: 1.87 m/s
Area-P 1/2: 2.22 cm2
Calc EF: 65 %
MV M vel: 4.33 m/s
MV Peak grad: 75 mmHg
S' Lateral: 3.98 cm
Single Plane A2C EF: 62.3 %
Single Plane A4C EF: 67.6 %

## 2021-11-28 ENCOUNTER — Telehealth: Payer: Self-pay | Admitting: Cardiology

## 2021-11-28 NOTE — Telephone Encounter (Signed)
-----   Message from Satira Sark, MD sent at 11/26/2021  7:03 PM EDT ----- Results reviewed.  Echocardiogram shows normal LVEF at 60 to 65%, also no significant valvular abnormalities.  Ascending aorta is mildly dilated at 39 mm.  Overall reassuring evaluation, await stress test results.

## 2021-11-28 NOTE — Telephone Encounter (Signed)
Pt calling to get results of Echo done. Requesting call back.

## 2021-11-28 NOTE — Telephone Encounter (Signed)
Patient informed. Copy sent to PCP °

## 2021-12-21 ENCOUNTER — Other Ambulatory Visit (HOSPITAL_COMMUNITY): Payer: 59

## 2021-12-21 ENCOUNTER — Encounter (HOSPITAL_COMMUNITY): Payer: 59

## 2021-12-31 ENCOUNTER — Other Ambulatory Visit (HOSPITAL_COMMUNITY): Payer: 59

## 2021-12-31 ENCOUNTER — Encounter (HOSPITAL_COMMUNITY): Payer: 59

## 2022-03-04 ENCOUNTER — Other Ambulatory Visit (HOSPITAL_BASED_OUTPATIENT_CLINIC_OR_DEPARTMENT_OTHER): Payer: Self-pay | Admitting: Family Medicine

## 2022-03-04 DIAGNOSIS — Z8546 Personal history of malignant neoplasm of prostate: Secondary | ICD-10-CM

## 2022-03-07 ENCOUNTER — Other Ambulatory Visit (HOSPITAL_BASED_OUTPATIENT_CLINIC_OR_DEPARTMENT_OTHER): Payer: Self-pay | Admitting: Family Medicine

## 2022-03-07 ENCOUNTER — Ambulatory Visit (HOSPITAL_BASED_OUTPATIENT_CLINIC_OR_DEPARTMENT_OTHER)
Admission: RE | Admit: 2022-03-07 | Discharge: 2022-03-07 | Disposition: A | Discharge status: Home / Self Care | Payer: 59 | Source: Ambulatory Visit | Attending: Family Medicine | Admitting: Family Medicine

## 2022-03-07 ENCOUNTER — Ambulatory Visit (HOSPITAL_BASED_OUTPATIENT_CLINIC_OR_DEPARTMENT_OTHER): Payer: 59

## 2022-03-07 ENCOUNTER — Encounter (HOSPITAL_BASED_OUTPATIENT_CLINIC_OR_DEPARTMENT_OTHER): Payer: Self-pay

## 2022-03-07 DIAGNOSIS — R1031 Right lower quadrant pain: Secondary | ICD-10-CM | POA: Insufficient documentation

## 2022-03-07 DIAGNOSIS — K5900 Constipation, unspecified: Secondary | ICD-10-CM | POA: Insufficient documentation

## 2022-03-07 DIAGNOSIS — G8929 Other chronic pain: Secondary | ICD-10-CM | POA: Insufficient documentation

## 2022-04-23 ENCOUNTER — Telehealth: Payer: Self-pay | Admitting: *Deleted

## 2022-04-23 NOTE — Telephone Encounter (Signed)
Contacted to see if he planned to do stress test. Says he does not want to proceed with stress test at this time

## 2022-04-23 NOTE — Telephone Encounter (Signed)
-----   Message from Merlene Laughter, RN sent at 11/22/2021 10:41 AM EDT ----- Regarding: EXERCISE MY/12/21/21/MCDOWELL

## 2022-07-11 ENCOUNTER — Encounter (HOSPITAL_BASED_OUTPATIENT_CLINIC_OR_DEPARTMENT_OTHER): Payer: Self-pay

## 2022-07-11 DIAGNOSIS — R0683 Snoring: Secondary | ICD-10-CM

## 2022-09-13 IMAGING — CR DG CHEST 2V
2 series · 2 of 2 positions shown · non-contrast
Comparison: 07/16/2016

CLINICAL DATA: Chest pain

EXAM:
CHEST - 2 VIEW

[chest pa]
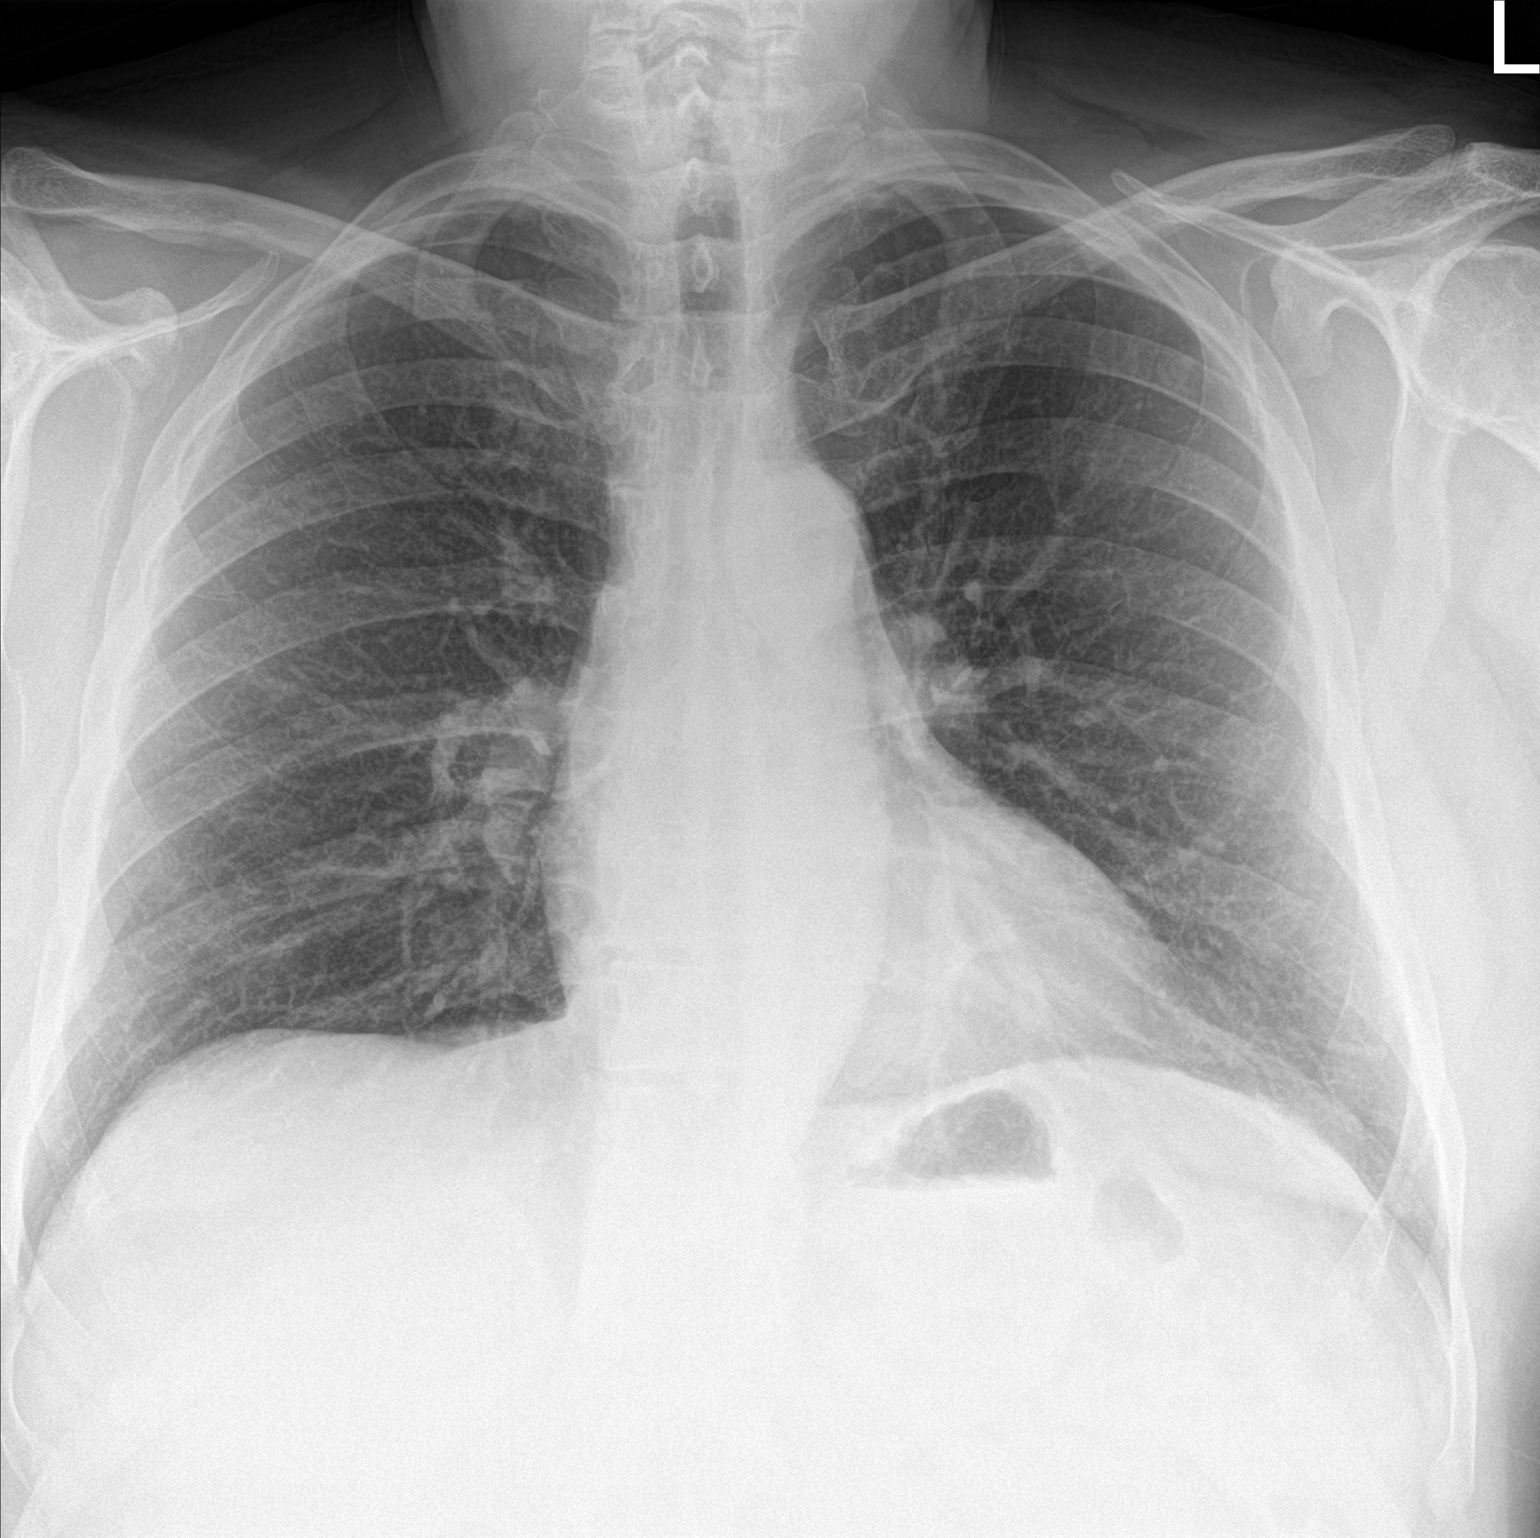

[chest lat]
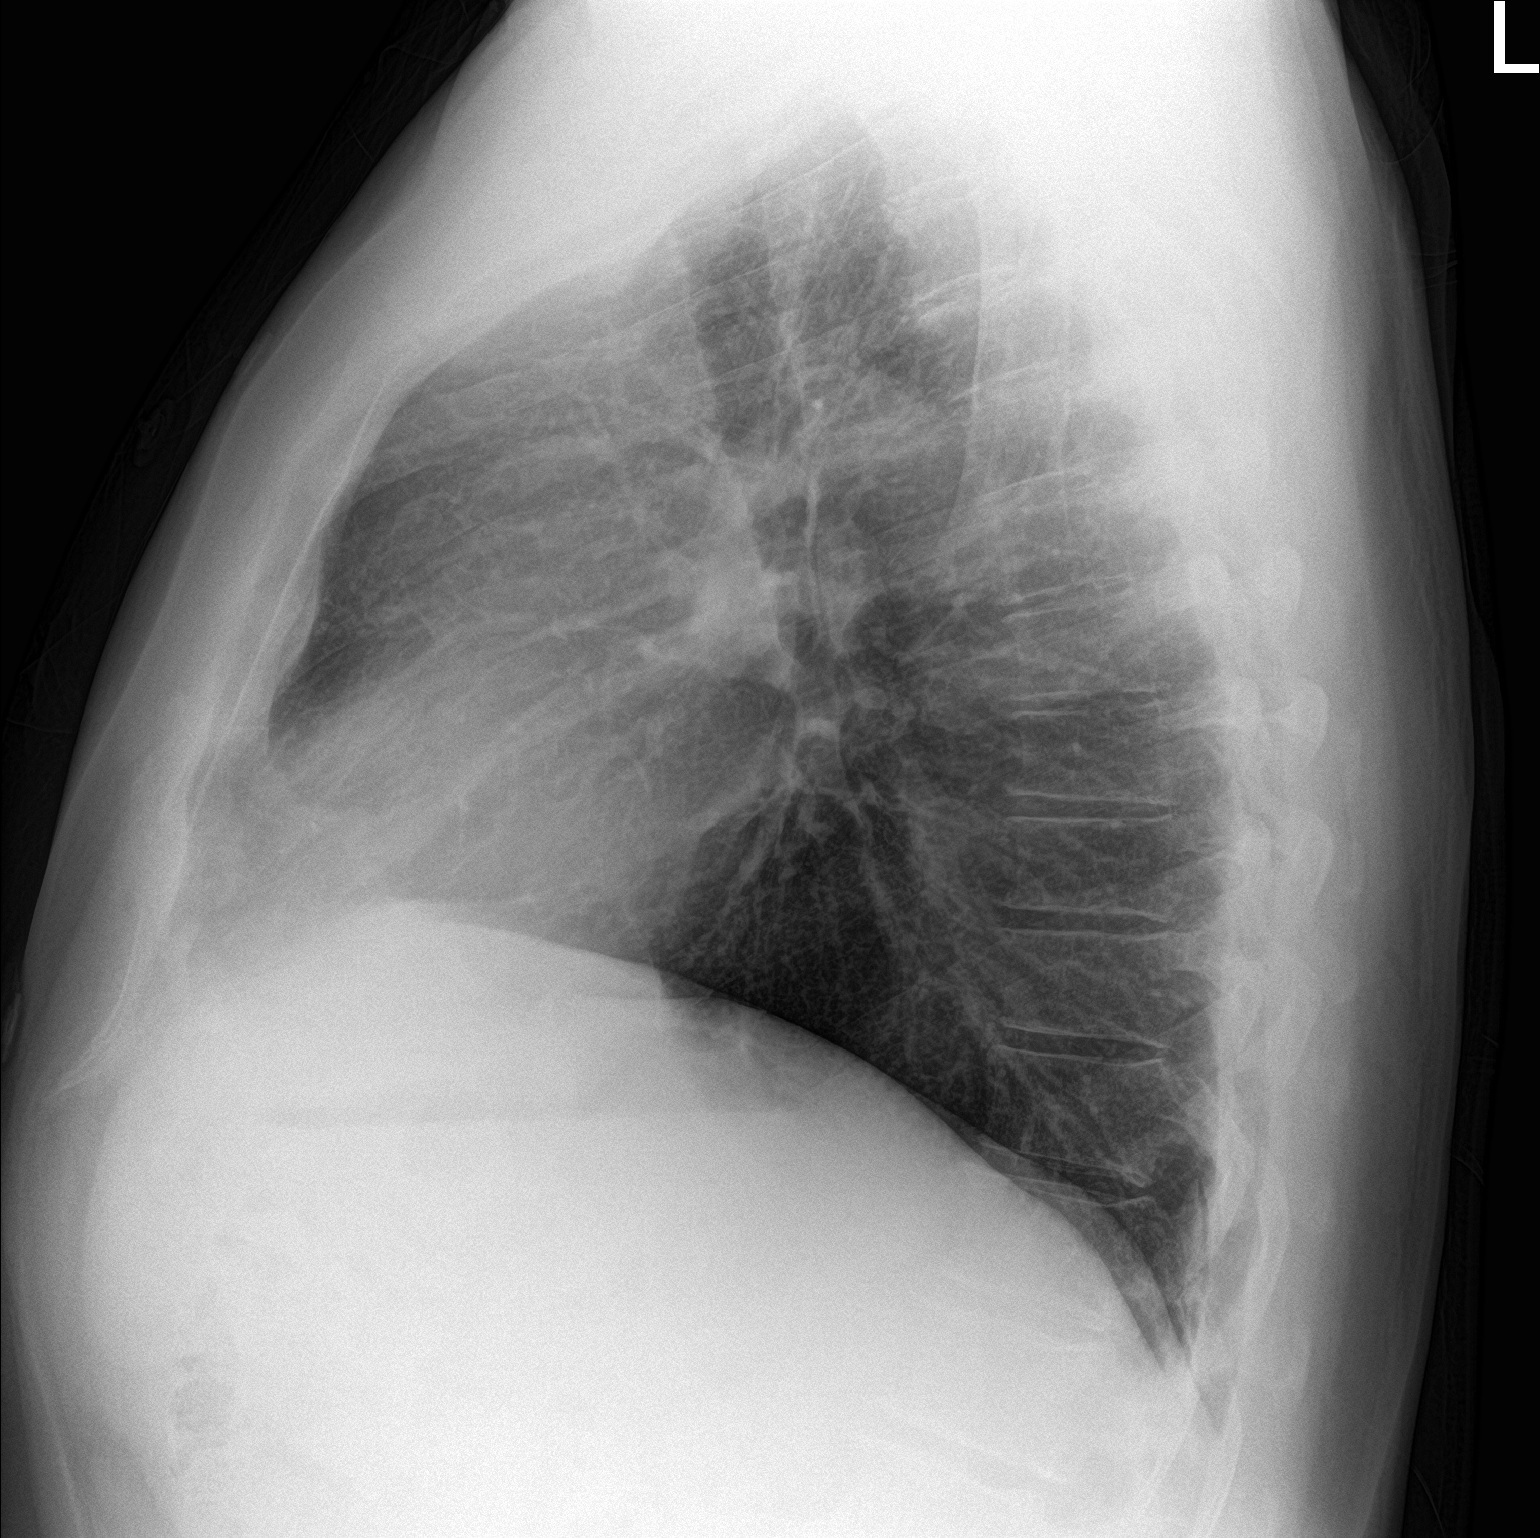

[2 of 2 positions shown; findings below may reference images not displayed]

FINDINGS: The heart size and mediastinal contours are within normal limits.
Both lungs are clear. The visualized skeletal structures are
unremarkable.
IMPRESSION: No active cardiopulmonary disease.

## 2022-09-14 IMAGING — CT CT ANGIO CHEST-ABD-PELV FOR DISSECTION W/ AND WO/W CM
2 of 7 series · 12 of 46 positions shown, 14 images · non-contrast
Comparison: PA and lateral chest yesterday, abdomen and pelvis CT
with contrast 06/08/2019. No prior chest CT for comparison.

CLINICAL DATA: Suspected acute aortic syndrome.

EXAM:
CT ANGIOGRAPHY CHEST, ABDOMEN AND PELVIS
TECHNIQUE: Non-contrast CT of the chest was initially obtained.

[Series 6: dissection 3.0 i30f 3 · axial · 0.98mm/px · z∈[+853,+1471]mm · 9 of 234 slices shown, 11 images]
[im 14/234  soft-tissue]
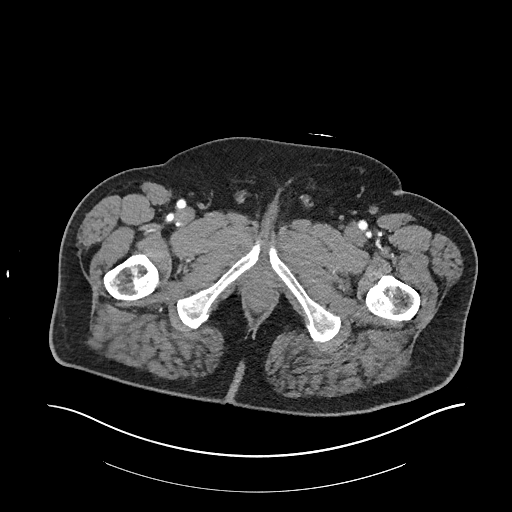
[im 14/234  bone]
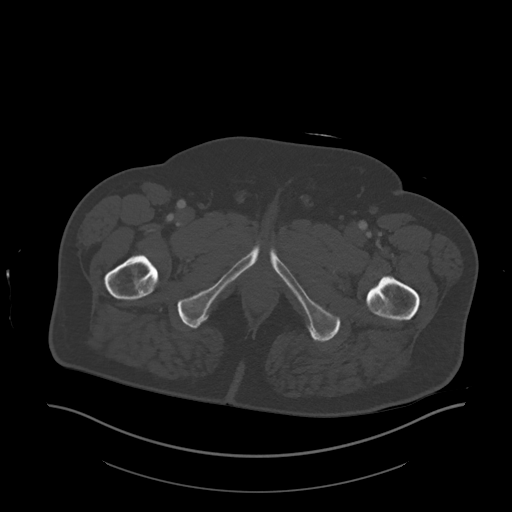
[im 42/234  soft-tissue]
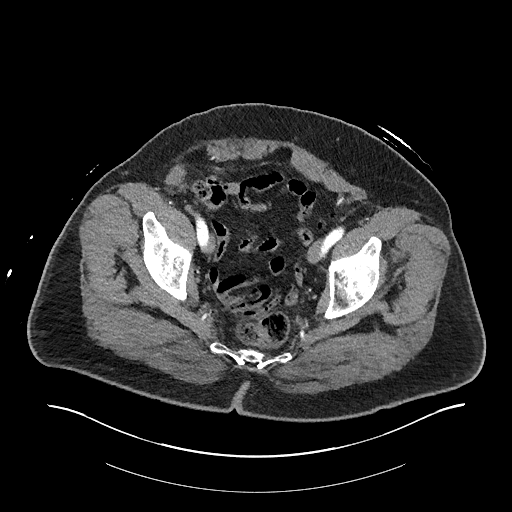
[im 69/234  soft-tissue]
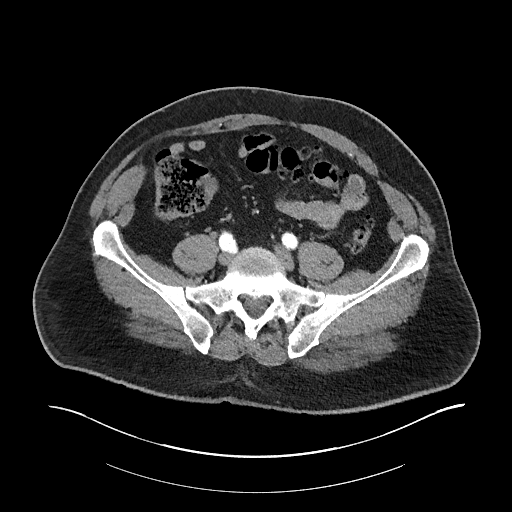
[im 96/234  soft-tissue]
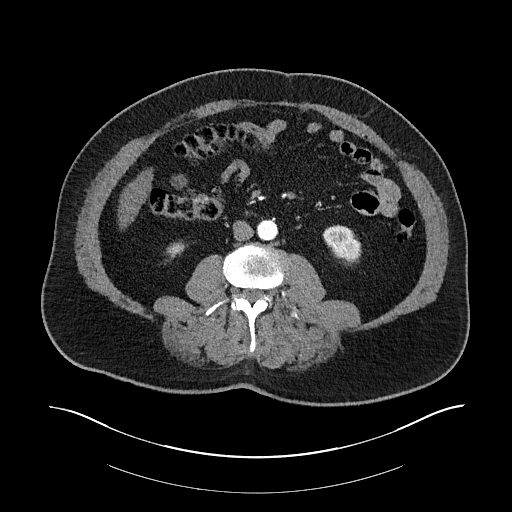
[im 124/234  soft-tissue]
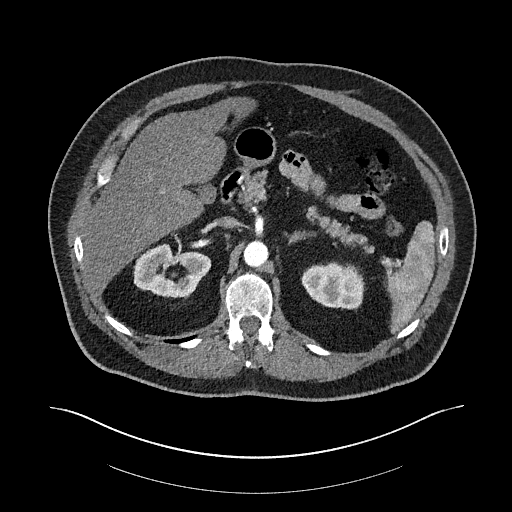
[im 138/234  soft-tissue]
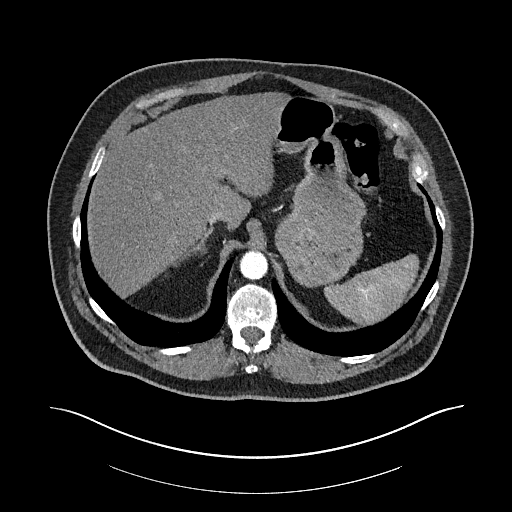
[im 165/234  soft-tissue]
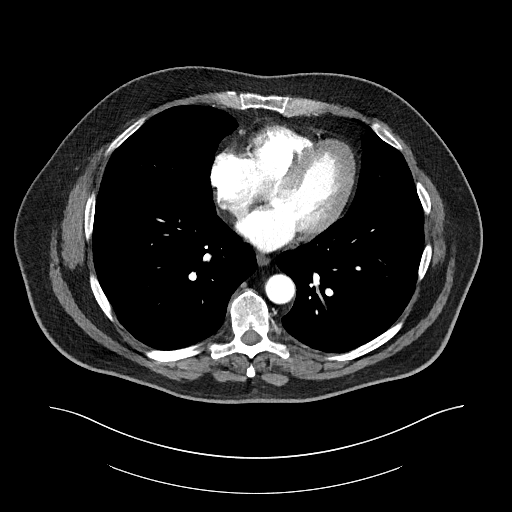
[im 192/234  soft-tissue]
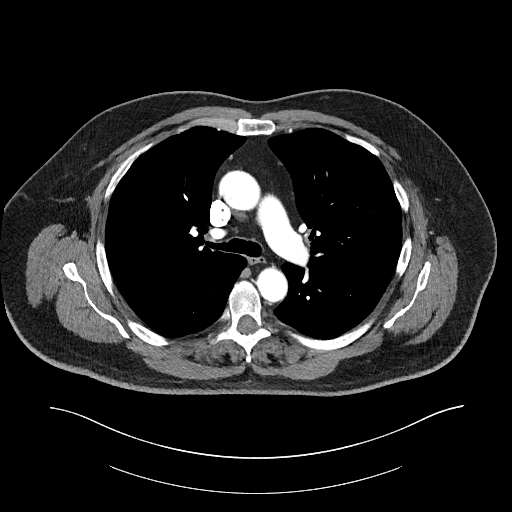
[im 220/234  soft-tissue]
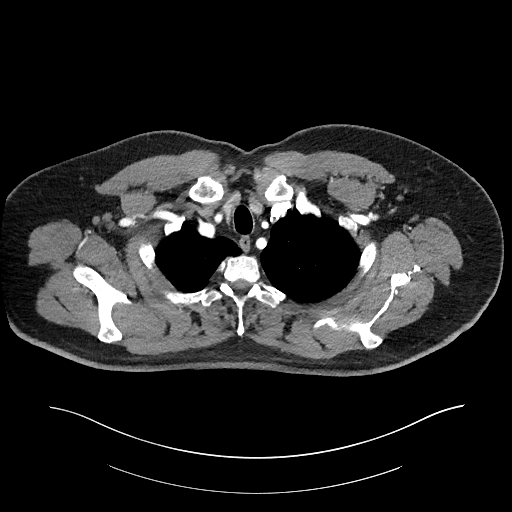
[im 220/234  bone]
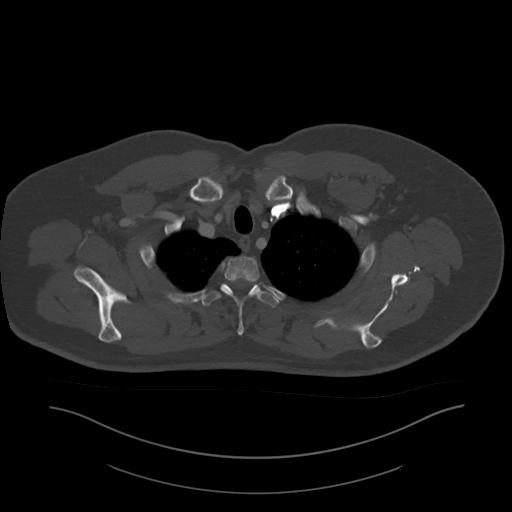

[Series 9: coronals · coronal · 0.96mm/px · 3 of 175 slices shown]
[im 44/175  soft-tissue]
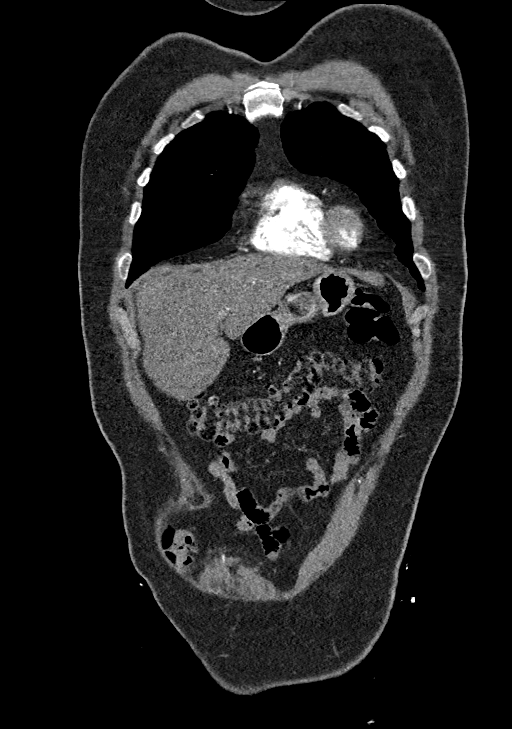
[im 88/175  soft-tissue]
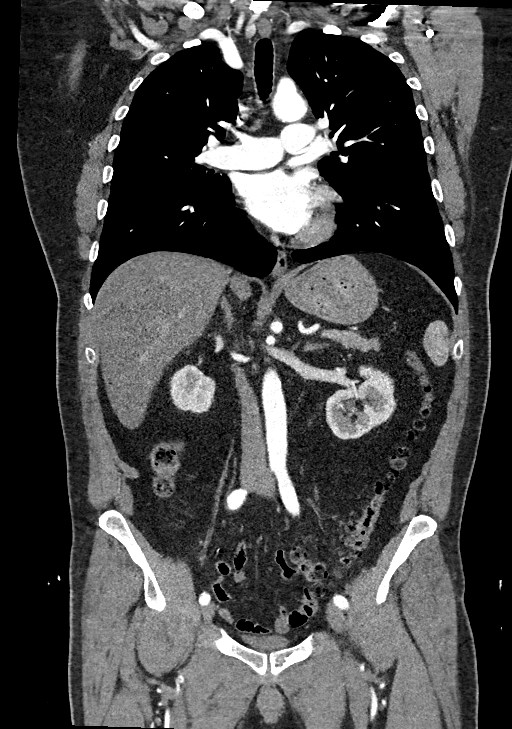
[im 131/175  soft-tissue]
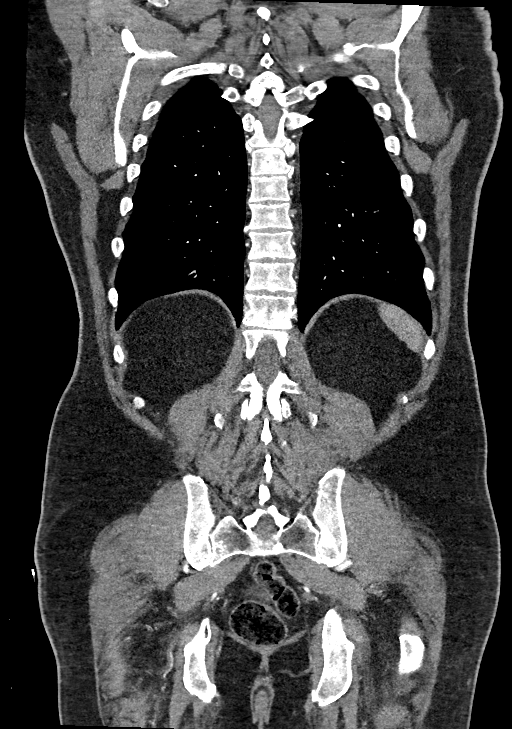

[12 of 46 positions shown; findings below may reference images not displayed]

Multidetector CT imaging through the chest, abdomen and pelvis was
performed using the standard protocol during bolus administration of
intravenous contrast. Multiplanar reconstructed images and MIPs were
obtained and reviewed to evaluate the vascular anatomy.

RADIATION DOSE REDUCTION: This exam was performed according to the
departmental dose-optimization program which includes automated
exposure control, adjustment of the mA and/or kV according to
patient size and/or use of iterative reconstruction technique.

CONTRAST:  100mL OMNIPAQUE IOHEXOL 350 MG/ML SOLN
FINDINGS: CTA CHEST FINDINGS

Cardiovascular: The cardiac size is normal. There is no pericardial
effusion. There is no pulmonary arterial dilatation, central embolus
or evidence of acute right heart strain.

No pericardial fluid is seen. There is trace atherosclerosis in the
aortic arch. The aortic root is ectatic at 3.7 cm, the ascending
segment borderline aneurysmal measuring 3.9 cm, remainder is within
normal caliber limits with mild descending tortuosity. There is no
dissection. The great vessels are patent. There is a nonstenosing
calcification in the proximal right subclavian artery.

Mediastinum/Nodes: There is no intrathoracic or axillary adenopathy.
There is an 8 mm low-density nodule in the left lobe of the thyroid
gland with otherwise unremarkable thyroid. The trachea is midline
and clear. There is no esophageal thickening. No supraclavicular
mass. The chest wall is unremarkable.

Lungs/Pleura: Stable 4.7 mm noncalcified subpleural nodule, lateral
left lower lobe base on series 8 axial 81. There is a pleural-based
7.3 x 4 mm noncalcified nodule in the posterior right apex.

Lungs are clear of infiltrates and no further nodules are seen.
Central airways are clear. There is no pleural effusion, thickening
or pneumothorax.

Musculoskeletal: Thoracic spine demonstrates degenerative disc
disease and spondylosis, but no destructive bone lesion. 9 mm
osteochondral loose body noted in the subcoracoid bursal space.
There is bilateral mild shoulder DJD.

Review of the MIP images confirms the above findings.

CTA ABDOMEN AND PELVIS FINDINGS

VASCULAR

Aorta: Normal caliber aorta without aneurysm, dissection, vasculitis
or significant stenosis. There is mild scattered atherosclerosis,
small amount.

Celiac: Patent without evidence of aneurysm, dissection, vasculitis
or significant stenosis.

SMA: Patent without evidence of aneurysm, dissection, vasculitis or
significant stenosis.

Renals: Both renal arteries are patent without evidence of aneurysm,
dissection, vasculitis, fibromuscular dysplasia or significant
stenosis.

IMA: Patent without evidence of aneurysm, dissection, vasculitis or
significant stenosis.

Inflow: Patent without evidence of aneurysm, dissection, vasculitis
or significant stenosis. There is trace calcification of the right
internal iliac artery origin, minimal scattered calcific plaque in
the left internal iliac artery. No visible external iliac artery
calcifications.

Veins: No obvious venous abnormality within the limitations of this
arterial phase study.

Review of the MIP images confirms the above findings.

NON-VASCULAR

Hepatobiliary: 19 cm length liver which is moderately steatotic with
increased steatosis compared to prior study. There is a 5 mm
chronic, likely cyst in segment 4A but no suspicious enhancement.
The gallbladder and bile ducts are unremarkable.

Pancreas: Unremarkable.

Spleen: Normal in size and enhancement.

Adrenals/Urinary Tract: There is no adrenal mass. There is a small
parapelvic left renal cyst. No cortical mass, calculus or
hydronephrosis seen. Bladder is unremarkable.

Stomach/Bowel: No dilatation or wall thickening. The appendix
surgically absent, as before. There are diverticula, most numerous
in the sigmoid segment without evidence of acute diverticulitis.
Constipation ascending and proximal transverse colon.

Lymphatic: No lymphadenopathy is seen in the abdomen or the pelvis.

Reproductive: There is no prostatomegaly. The patient is status post
prostatectomy.

Other: There is no incarcerated hernia. There are small inguinal fat
hernias. There is no free air, hemorrhage or fluid.

Musculoskeletal: There is osteopenia with multilevel degenerative
change in lumbar spine. Left pubic bone island is chronically noted.
There is severe acquired spinal canal stenosis at L4-5, due to disc
osteophyte complex, ligamentous and facet hypertrophy.

Review of the MIP images confirms the above findings.
IMPRESSION: 1. No acute CT or CTA abnormality.
2. Minimal aortic atherosclerosis, with borderline aneurysmal
ascending segment 3.9 cm. No dissection or penetrating ulcer.
3. 7.3 x 4 mm left upper lobe apical noncalcified nodule. Six-month
follow-up CT recommended.
4. 8 mm nodule left lobe of thyroid. No imaging follow-up is
recommended.
5. Moderate hepatic steatosis, with 4 mm chronic hypodensity most
likely a cyst in the left lobe.
6. Constipation and diverticulosis.
7. Prior appendectomy and prostatectomy.
8. Severe acquired spinal stenosis L4-5.  Osteopenia.

## 2022-12-18 ENCOUNTER — Other Ambulatory Visit: Payer: Self-pay | Admitting: Orthopedic Surgery

## 2022-12-18 DIAGNOSIS — M25512 Pain in left shoulder: Secondary | ICD-10-CM

## 2022-12-20 ENCOUNTER — Other Ambulatory Visit (HOSPITAL_BASED_OUTPATIENT_CLINIC_OR_DEPARTMENT_OTHER): Payer: Self-pay | Admitting: Family Medicine

## 2022-12-20 DIAGNOSIS — R911 Solitary pulmonary nodule: Secondary | ICD-10-CM

## 2023-01-05 ENCOUNTER — Other Ambulatory Visit: Payer: 59

## 2023-01-31 ENCOUNTER — Ambulatory Visit (HOSPITAL_BASED_OUTPATIENT_CLINIC_OR_DEPARTMENT_OTHER)
Admission: RE | Admit: 2023-01-31 | Discharge: 2023-01-31 | Disposition: A | Discharge status: Home / Self Care | Payer: 59 | Source: Ambulatory Visit | Attending: Family Medicine | Admitting: Family Medicine

## 2023-01-31 DIAGNOSIS — R911 Solitary pulmonary nodule: Secondary | ICD-10-CM

## 2023-06-23 ENCOUNTER — Other Ambulatory Visit: Payer: Self-pay | Admitting: Gastroenterology

## 2023-06-23 DIAGNOSIS — R1084 Generalized abdominal pain: Secondary | ICD-10-CM

## 2023-06-26 ENCOUNTER — Ambulatory Visit
Admission: RE | Admit: 2023-06-26 | Discharge: 2023-06-26 | Disposition: A | Discharge status: Home / Self Care | Payer: 59 | Source: Ambulatory Visit | Attending: Gastroenterology | Admitting: Gastroenterology

## 2023-06-26 DIAGNOSIS — R1084 Generalized abdominal pain: Secondary | ICD-10-CM

## 2023-06-26 MED ORDER — IOPAMIDOL (ISOVUE-370) INJECTION 76%
500.0000 mL | Freq: Once | INTRAVENOUS | Status: AC | PRN
  Administered 2023-06-26: 85 mL via INTRAVENOUS

## 2023-06-27 ENCOUNTER — Emergency Department (HOSPITAL_COMMUNITY)
Admission: EM | Admit: 2023-06-27 | Discharge: 2023-06-27 | Disposition: A | Discharge status: Home / Self Care | Payer: Managed Care, Other (non HMO) | Attending: Emergency Medicine | Admitting: Emergency Medicine

## 2023-06-27 ENCOUNTER — Encounter (HOSPITAL_COMMUNITY): Payer: Self-pay | Admitting: Emergency Medicine

## 2023-06-27 DIAGNOSIS — Z7982 Long term (current) use of aspirin: Secondary | ICD-10-CM | POA: Insufficient documentation

## 2023-06-27 DIAGNOSIS — R110 Nausea: Secondary | ICD-10-CM | POA: Insufficient documentation

## 2023-06-27 DIAGNOSIS — K5900 Constipation, unspecified: Secondary | ICD-10-CM | POA: Diagnosis not present

## 2023-06-27 DIAGNOSIS — R1031 Right lower quadrant pain: Secondary | ICD-10-CM

## 2023-06-27 DIAGNOSIS — R109 Unspecified abdominal pain: Secondary | ICD-10-CM | POA: Diagnosis present

## 2023-06-27 LAB — COMPREHENSIVE METABOLIC PANEL
ALT: 30 U/L (ref 0–44)
AST: 28 U/L (ref 15–41)
Albumin: 4.5 g/dL (ref 3.5–5.0)
Alkaline Phosphatase: 66 U/L (ref 38–126)
Anion gap: 11 (ref 5–15)
BUN: 15 mg/dL (ref 8–23)
CO2: 24 mmol/L (ref 22–32)
Calcium: 10.3 mg/dL (ref 8.9–10.3)
Chloride: 101 mmol/L (ref 98–111)
Creatinine, Ser: 0.79 mg/dL (ref 0.61–1.24)
GFR, Estimated: >60 mL/min (ref >60)
Glucose, Bld: 155 mg/dL — ABNORMAL HIGH (ref 70–99)
Potassium: 3.6 mmol/L (ref 3.5–5.1)
Sodium: 136 mmol/L (ref 135–145)
Total Bilirubin: 1.1 mg/dL (ref 0.0–1.2)
Total Protein: 8 g/dL (ref 6.5–8.1)

## 2023-06-27 LAB — URINALYSIS, ROUTINE W REFLEX MICROSCOPIC
Bilirubin Urine: NEGATIVE
Glucose, UA: NEGATIVE mg/dL
Hgb urine dipstick: NEGATIVE
Ketones, ur: NEGATIVE mg/dL
Leukocytes,Ua: NEGATIVE
Nitrite: NEGATIVE
Protein, ur: NEGATIVE mg/dL
Specific Gravity, Urine: 1.017 (ref 1.005–1.030)
pH: 5 (ref 5.0–8.0)

## 2023-06-27 LAB — CBC
HCT: 43.7 % (ref 39.0–52.0)
Hemoglobin: 15.1 g/dL (ref 13.0–17.0)
MCH: 30.1 pg (ref 26.0–34.0)
MCHC: 34.6 g/dL (ref 30.0–36.0)
MCV: 87.1 fL (ref 80.0–100.0)
Platelets: 229 10*3/uL (ref 150–400)
RBC: 5.02 MIL/uL (ref 4.22–5.81)
RDW: 12.9 % (ref 11.5–15.5)
WBC: 7.7 10*3/uL (ref 4.0–10.5)
nRBC: 0 % (ref 0.0–0.2)

## 2023-06-27 LAB — LIPASE, BLOOD: Lipase: 33 U/L (ref 11–51)

## 2023-06-27 MED ORDER — ONDANSETRON 4 MG PO TBDP: 4.0000 mg | ORAL_TABLET | Freq: Four times a day (QID) | ORAL | 0 refills | Status: DC | PRN

## 2023-06-27 NOTE — ED Provider Triage Note (Signed)
 Emergency Medicine Provider Triage Evaluation Note  Jay Haskew , a 63 y.o. male  was evaluated in triage.  Pt complains of abdominal pain intermittently for a month, he reports right sided nature, he has a history of appendicitis with infection remotely as well as hernia repair on the right.  He is concerned that something may have come loose and his hernia repair.  Patient had an outpatient CT scan done yesterday, but woke up this morning with worsening pain and wanted to ensure that something had not worsened, took his temperature at home and was very low, concern for developing sepsis.  He denies any chills but does endorse some sweating.  Intermittent nausea for 4 to 5 days.  He reports pain has been constant for around a week.  He reports that he has been intermittently constipated, last bowel movement 4 to 5 days ago.  Review of Systems  Positive: Abdominal pain, sweats, constipation Negative: Fever, chills, emesis  Physical Exam  BP (!) 147/86 (BP Location: Left Arm)   Pulse 62   Temp 98.3 F (36.8 C) (Oral)   Resp 18   Ht 6' 2 (1.88 m)   Wt 115.7 kg   SpO2 98%   BMI 32.74 kg/m  Gen:   Awake, no distress   Resp:  Normal effort  MSK:   Moves extremities without difficulty  Other:  Some tenderness to palpation in the right lower quadrant, no rebound, rigidity, guarding, no focal distention noted.  Medical Decision Making  Medically screening exam initiated at 8:25 AM.  Appropriate orders placed.  Elsie Addie Derby was informed that the remainder of the evaluation will be completed by another provider, this initial triage assessment does not replace that evaluation, and the importance of remaining in the ED until their evaluation is complete.  Workup initiated in triage    Rosan Sherlean DEL, [place] 06/27/23 0825

## 2023-06-27 NOTE — Discharge Instructions (Signed)
 He can take the nausea medication I prescribed up to every 6 hours, I would use caution using this medication however as it can be somewhat constipating.  For the constipation I recommend taking 1-2 caps of MiraLAX daily, drinking 50 to 64 ounces of water  a day, and doing some physical activity such as walking a mile or 2 to help stimulate the bowels.  I encouraged a high-fiber diet.  Please follow-up with your GI doctor and return to the emergency department you have significant worsening pain, nausea, or severe fever despite treatment.

## 2023-06-27 NOTE — ED Provider Notes (Signed)
 [name] Provider Note   CSN: [id number] Arrival date & time: 06/27/23  9291     History  Chief Complaint  Patient presents with   Abdominal Pain    Edward Arroyo is a 63 y.o. male Pt complains of abdominal pain intermittently for a month, he reports right sided nature, he has a history of appendicitis with infection remotely as well as hernia repair on the right.  He is concerned that something may have come loose and his hernia repair.  Patient had an outpatient CT scan done yesterday, but woke up this morning with worsening pain and wanted to ensure that something had not worsened, took his temperature at home and was very low, concern for developing sepsis.  He denies any chills but does endorse some sweating.  Intermittent nausea for 4 to 5 days.  He reports pain has been constant for around a week.  He reports that he has been intermittently constipated, last bowel movement 4 to 5 days ago.    Abdominal Pain      Home Medications Prior to Admission medications   Medication Sig Start Date End Date Taking? Authorizing Provider  ondansetron  (ZOFRAN -ODT) 4 MG disintegrating tablet Take 1 tablet (4 mg total) by mouth every 6 (six) hours as needed for nausea or vomiting. 06/27/23  Yes Mieczyslaw Stamas H, PA-C  aspirin EC 81 MG tablet Take 81 mg by mouth See admin instructions. Tuesday and thursday    [provider]  cholecalciferol (VITAMIN D) 25 MCG (1000 UNIT) tablet Take 1,000 Units by mouth daily.    [provider]  cyclobenzaprine  (FLEXERIL ) 10 MG tablet Take 1 tablet (10 mg total) by mouth 2 (two) times daily as needed for muscle spasms. 09/11/21   Griselda Norris, MD  ELDERBERRY PO Take 1 capsule by mouth daily.    [provider]  LUTEIN PO Take 1 tablet by mouth daily.    [provider]  MAGNESIUM GLUCONATE PO Take by mouth. 1 tab every morning & 2 tabs every evening    [provider]  olmesartan -hydrochlorothiazide  (BENICAR  HCT) 40-25 MG tablet Take 1 tablet by mouth daily.    [provider]  vitamin C (ASCORBIC ACID) 500 MG tablet Take 500 mg by mouth daily.    [provider]  zinc gluconate 50 MG tablet Take 50 mg by mouth daily.    [provider]      Allergies    Patient has no known allergies.    Review of Systems   Review of Systems  Gastrointestinal:  Positive for abdominal pain.  All other systems reviewed and are negative.   Physical Exam Updated Vital Signs BP (!) 151/87 (BP Location: Left Arm)   Pulse 62   Temp 98.3 F (36.8 C) (Oral)   Resp 14   Ht 6' 2 (1.88 m)   Wt 115.7 kg   SpO2 97%   BMI 32.74 kg/m  Physical Exam Vitals and nursing note reviewed.  Constitutional:      General: He is not in acute distress.    Appearance: Normal appearance.  HENT:     Head: Normocephalic and atraumatic.  Eyes:     General:        Right eye: No discharge.        Left eye: No discharge.  Cardiovascular:     Rate and Rhythm: Normal rate and regular rhythm.     Heart sounds: No murmur  heard.    No friction rub. No gallop.  Pulmonary:     Effort: Pulmonary effort is normal.     Breath sounds: Normal breath sounds.  Abdominal:     General: Bowel sounds are normal.     Palpations: Abdomen is soft.     Comments:   Some tenderness to palpation in the right lower quadrant, no rebound, rigidity, guarding, no focal distention noted.  Skin:    General: Skin is warm and dry.     Capillary Refill: Capillary refill takes less than 2 seconds.  Neurological:     Mental Status: He is alert and oriented to person, place, and time.  Psychiatric:        Mood and Affect: Mood normal.        Behavior: Behavior normal.     ED Results / Procedures / Treatments   Labs (all labs ordered are listed, but only abnormal results are displayed) Labs Reviewed  COMPREHENSIVE METABOLIC PANEL - Abnormal; Notable for the  following components:      Result Value   Glucose, Bld 155 (*)    All other components within normal limits  CBC  LIPASE, BLOOD  URINALYSIS, ROUTINE W REFLEX MICROSCOPIC    EKG None  Radiology CT ABDOMEN PELVIS W CONTRAST Result Date: 06/27/2023 CLINICAL DATA:  Right inguinal hernia follow-up. EXAM: CT ABDOMEN AND PELVIS WITH CONTRAST TECHNIQUE: Multidetector CT imaging of the abdomen and pelvis was performed using the standard protocol following bolus administration of intravenous contrast. RADIATION DOSE REDUCTION: This exam was performed according to the departmental dose-optimization program which includes automated exposure control, adjustment of the mA and/or kV according to patient size and/or use of iterative reconstruction technique. CONTRAST:  85mL ISOVUE -370 IOPAMIDOL  (ISOVUE -370) INJECTION 76% COMPARISON:  CT scan abdomen and pelvis from 03/07/2022. FINDINGS: Lower chest: The lung bases are clear. No pleural effusion. The heart is normal in size. No pericardial effusion. Hepatobiliary: The liver is normal in size. Non-cirrhotic configuration. No suspicious mass. There is a subcentimeter ill-defined hypoattenuating focus in the left hepatic lobe, segment 4A, which is too small to adequately characterize but appears unchanged since the prior study and favored benign in etiology. No intrahepatic or extrahepatic bile duct dilation. No calcified gallstones. Normal gallbladder wall thickness. No pericholecystic inflammatory changes. Pancreas: Unremarkable. No pancreatic ductal dilatation or surrounding inflammatory changes. Spleen: Normal-sized spleen. Redemonstration of several ill-defined hypoattenuating lesions, which are too small to adequately characterize but unchanged since multiple prior studies and favored benign due to long-term stability. Adrenals/Urinary Tract: Adrenal glands are unremarkable. No suspicious renal mass. There are several sinus cysts in the left kidney with largest  measuring up to 1.7 x 2.5 cm. No hydronephrosis. No renal or ureteric calculi. Unremarkable urinary bladder. Stomach/Bowel: No disproportionate dilation of the small or large bowel loops. No evidence of abnormal bowel wall thickening or inflammatory changes. The appendix is surgically absent. There are multiple diverticula mainly in the left hemi colon, without imaging signs of diverticulitis. Vascular/Lymphatic: No ascites or pneumoperitoneum. No abdominal or pelvic lymphadenopathy, by size criteria. No aneurysmal dilation of the major abdominal arteries. There are mild peripheral atherosclerotic vascular calcifications of the aorta and its major branches. Reproductive: Prostate is surgically absent. Other: The visualized soft tissues and abdominal wall are unremarkable. Musculoskeletal: No suspicious osseous lesions. There are mild multilevel degenerative changes in the visualized spine. IMPRESSION: 1. No acute inflammatory process identified within the abdomen or pelvis. 2. No right inguinal hernia. 3. Multiple other nonacute observations, as  described above. Aortic Atherosclerosis (ICD10-I70.0). Electronically Signed   By: Ree Molt M.D.   On: 06/27/2023 08:36    Procedures Procedures    Medications Ordered in ED Medications - No data to display  ED Course/ Medical Decision Making/ A&P                                 Medical Decision Making Amount and/or Complexity of Data Reviewed Labs: ordered.    This patient is a 63 y.o. male  who presents to the ED for concern of abdominal pain, nausea, sweats.   Differential diagnoses prior to evaluation: The emergent differential diagnosis includes, but is not limited to,  The causes of generalized abdominal pain include but are not limited to AAA, mesenteric ischemia, appendicitis, diverticulitis, DKA, gastritis, gastroenteritis, AMI, nephrolithiasis, pancreatitis, peritonitis, adrenal insufficiency,lead poisoning, iron toxicity, intestinal  ischemia, constipation, UTI,SBO/LBO, splenic rupture, biliary disease, IBD, IBS, PUD, or hepatitis. This is not an exhaustive differential.   Past Medical History / Co-morbidities / Social History: Previous surgical history of appendectomy, hypertension, hemorrhoids, chronic back pain, GERD, IBS  Additional history: Chart reviewed. Pertinent results include: Reviewed outpatient evaluation by GI doctor and remote lab work, imaging from emergency department evaluations.  Physical Exam: Physical exam performed. The pertinent findings include: Some mild tenderness in the right lower quadrant, no rebound, rigidity, guarding.  Patient is nontoxic, nonseptic appearing.  He is somewhat hypertensive in the ED, blood pressure 151/87, vital signs otherwise stable, afebrile.  Lab Tests/Imaging studies: I personally interpreted labs/imaging and the pertinent results include: CBC unremarkable, CMP unremarkable other than mildly elevated glucose for nonfasting lab values, glucose 155.  Lipase normal, UA normal.  We expedited his outpatient CT of the abdomen and pelvis with contrast performed yesterday, I independently interpreted these images which showed no evidence of acute intra-abdominal abnormality, no new hernia, no complications for remote hernia repair.. I agree with the radiologist interpretation.  Medications: I ordered medication including, discharged with Zofran  to use every 6 hours as needed for nausea, but discussed that it can be somewhat constipating, in context of his otherwise noted constipation I recommend he take MiraLAX 1-2 times daily, drink plenty of fluids, eat a high-fiber diet..  I have reviewed the patients home medicines and have made adjustments as needed.   Disposition: After consideration of the diagnostic results and the patients response to treatment, I feel that patient stable for discharge with close GI follow-up, no acute abnormality noted in the ED today.   emergency  department workup does not suggest an emergent condition requiring admission or immediate intervention beyond what has been performed at this time. The plan is: as above. The patient is safe for discharge and has been instructed to return immediately for worsening symptoms, change in symptoms or any other concerns.  Final Clinical Impression(s) / ED Diagnoses Final diagnoses:  Right lower quadrant abdominal pain  Constipation, unspecified constipation type  Nausea    Rx / DC Orders ED Discharge Orders          Ordered    ondansetron  (ZOFRAN -ODT) 4 MG disintegrating tablet  Every 6 hours PRN        06/27/23 1348              Ismerai Bin, [place] H, PA-C 06/27/23 1352    Doretha Folks, MD 06/27/23 1455

## 2023-06-27 NOTE — ED Triage Notes (Signed)
 Pt endorses abd pain for a month. CT scan yesterday but has not had results yet. Pt endorses sweating. Nausea intermittently for 4-5 days. Last normal BM 4-5 days ago.

## 2024-02-18 ENCOUNTER — Other Ambulatory Visit: Payer: Self-pay | Admitting: Family Medicine

## 2024-02-18 DIAGNOSIS — R9389 Abnormal findings on diagnostic imaging of other specified body structures: Secondary | ICD-10-CM

## 2024-02-27 ENCOUNTER — Encounter: Payer: Self-pay | Admitting: Nurse Practitioner

## 2024-02-27 ENCOUNTER — Ambulatory Visit: Attending: Nurse Practitioner | Admitting: Nurse Practitioner

## 2024-02-27 VITALS — BP 132/88 | HR 54 | Ht 73.0 in | Wt 272.2 lb

## 2024-02-27 DIAGNOSIS — Z136 Encounter for screening for cardiovascular disorders: Secondary | ICD-10-CM | POA: Diagnosis not present

## 2024-02-27 DIAGNOSIS — I77819 Aortic ectasia, unspecified site: Secondary | ICD-10-CM | POA: Diagnosis not present

## 2024-02-27 DIAGNOSIS — I700 Atherosclerosis of aorta: Secondary | ICD-10-CM | POA: Diagnosis not present

## 2024-02-27 DIAGNOSIS — I10 Essential (primary) hypertension: Secondary | ICD-10-CM | POA: Diagnosis not present

## 2024-02-27 NOTE — Patient Instructions (Addendum)

## 2024-02-27 NOTE — Progress Notes (Signed)
 Cardiology Office Note   Date:  02/27/2024 ID:  Edward Arroyo, DOB Jul 28, 1960, MRN [id number] PCP: [name] Providers Cardiologist:  Jayson Sierras, MD     History of Present Illness Edward Arroyo is a 63 y.o. male with a PMH of chest pain, borderline diabetes, hypertension, minimal aortic atherosclerosis, hepatic steatosis, spinal stenosis, history of diverticulosis, aortic dilatation, who presents today for overdue follow-up.  Last seen by Dr. Sierras on November 22, 2021.  At that time, patient noted difficulty with insomnia, elevated BPs, fatigue in his chest when waking up in the morning, denied any reproducible chest discomfort at the time.  Has spoken with his PCP at the time about possible sleep apnea evaluation.  Noted to have a family history of premature CAD with 2 of his brothers.  Ischemic evaluation was discussed with patient at this visit, as well as echocardiogram.  I do not see where this ischemic evaluation was done per chart review.  See echocardiogram report below.   Today he presents for overdue follow-up.  He states he is doing well. Tells me he has been helping his wife who has undergone surgery for retinal detachment 3 times within the past year. Overall doing well from a cardiac perspective.  Tells me his PCP states he needs to have his aorta checked. Denies any chest pain, shortness of breath, palpitations, syncope, presyncope, dizziness, orthopnea, PND, swelling or significant weight changes, acute bleeding, or claudication.  SH: Enjoys gardening, fishing, and being outdoors.  ROS: Negative.  See HPI.  Studies Reviewed  EKG:  EKG Interpretation Date/Time:  Friday February 27 2024 13:38:58 EDT Ventricular Rate:  54 PR Interval:  178 QRS Duration:  92 QT Interval:  512 QTC Calculation: 485 R Axis:   -38  Text Interpretation: Sinus bradycardia Left axis deviation Cannot rule out Inferior infarct  (cited on or before 12-Mar-2018) When compared with ECG of 10-Sep-2021 11:49, ST no longer elevated in Inferior leads Confirmed by Miriam Norris [phone]) on 02/27/2024 1:41:13 PM   Echo 11/2021:  1. Left ventricular ejection fraction, by estimation, is 60 to 65%. The  left ventricle has normal function. The left ventricle has no regional  wall motion abnormalities. Left ventricular diastolic parameters are  indeterminate.   2. Right ventricular systolic function is normal. The right ventricular  size is normal. Tricuspid regurgitation signal is inadequate for assessing  PA pressure.   3. The mitral valve is normal in structure. Trivial mitral valve  regurgitation. No evidence of mitral stenosis.   4. The aortic valve is tricuspid. Aortic valve regurgitation is not  visualized. No aortic stenosis is present.   5. Aortic dilatation noted. There is mild dilatation of the ascending  aorta, measuring 39 mm.  Physical Exam VS:  BP 132/88 (BP Location: Left Arm)   Pulse (!) 54   Ht 6' 1 (1.854 m)   Wt 272 lb 3.2 oz (123.5 kg)   SpO2 95%   BMI 35.91 kg/m        Wt Readings from Last 3 Encounters:  02/27/24 272 lb 3.2 oz (123.5 kg)  06/27/23 255 lb (115.7 kg)  11/22/21 275 lb 12.8 oz (125.1 kg)    GEN: Well nourished, well developed in no acute distress NECK: No JVD; No carotid bruits CARDIAC: S1/S2, slow rate and regular rhythm, no murmurs, rubs, gallops RESPIRATORY:  Clear to auscultation without rales, wheezing or rhonchi  ABDOMEN: Soft, non-tender, non-distended EXTREMITIES:  No edema; No  deformity   ASSESSMENT AND PLAN  HTN BP is well controlled per his report. Discussed SBP < 130. Discussed to monitor BP at home at least 2 hours after medications and sitting for 5-10 minutes.  Discussed when to notify our office.  He verbalized understanding. Heart healthy diet and regular cardiovascular exercise encouraged.   Minimal aortic atherosclerosis Continue current medication regimen.  Heart healthy diet and regular cardiovascular exercise encouraged.  Continue to follow with PCP.  Aortic dilatation Echo in 2023 revealed mild dilatation of ascending aorta measuring 39 mm. Echo cannot be updated at this time as he does not have a current diagnoses that will cover this. He has upcoming CT scan of his chest that is monitoring pulmonary nodule. No significant vascular findings noted on CT scan 1 year ago. Will continue to follow and consider CT scan of aorta in 1 year for surveillance.      I spent a total duration of 20 minutes reviewing prior notes, reviewing outside records including  labs, EKG today, face-to-face counseling of medical condition, pathophysiology, evaluation, management, and documenting the findings in the note.    Dispo: Care and ED precautions discussed. Follow-up with MD/APP in 1 year or sooner if anything changes.   Signed, Almarie Crate, NP

## 2024-04-02 ENCOUNTER — Ambulatory Visit
Admission: RE | Admit: 2024-04-02 | Discharge: 2024-04-02 | Disposition: A | Discharge status: Home / Self Care | Source: Ambulatory Visit | Attending: Family Medicine | Admitting: Family Medicine

## 2024-04-02 DIAGNOSIS — R9389 Abnormal findings on diagnostic imaging of other specified body structures: Secondary | ICD-10-CM

## 2024-04-27 ENCOUNTER — Telehealth: Payer: Self-pay | Admitting: Nurse Practitioner

## 2024-04-27 ENCOUNTER — Other Ambulatory Visit: Payer: Self-pay

## 2024-04-27 ENCOUNTER — Emergency Department (HOSPITAL_BASED_OUTPATIENT_CLINIC_OR_DEPARTMENT_OTHER)
Admission: EM | Admit: 2024-04-27 | Discharge: 2024-04-27 | Disposition: A | Discharge status: Home / Self Care | Source: Ambulatory Visit | Attending: Emergency Medicine | Admitting: Emergency Medicine

## 2024-04-27 ENCOUNTER — Emergency Department (HOSPITAL_BASED_OUTPATIENT_CLINIC_OR_DEPARTMENT_OTHER): Admitting: Radiology

## 2024-04-27 ENCOUNTER — Encounter (HOSPITAL_BASED_OUTPATIENT_CLINIC_OR_DEPARTMENT_OTHER): Payer: Self-pay

## 2024-04-27 ENCOUNTER — Emergency Department (HOSPITAL_BASED_OUTPATIENT_CLINIC_OR_DEPARTMENT_OTHER)

## 2024-04-27 DIAGNOSIS — M546 Pain in thoracic spine: Secondary | ICD-10-CM | POA: Diagnosis present

## 2024-04-27 DIAGNOSIS — I10 Essential (primary) hypertension: Secondary | ICD-10-CM | POA: Insufficient documentation

## 2024-04-27 DIAGNOSIS — Z79899 Other long term (current) drug therapy: Secondary | ICD-10-CM | POA: Insufficient documentation

## 2024-04-27 DIAGNOSIS — R911 Solitary pulmonary nodule: Secondary | ICD-10-CM | POA: Diagnosis not present

## 2024-04-27 DIAGNOSIS — R079 Chest pain, unspecified: Secondary | ICD-10-CM | POA: Insufficient documentation

## 2024-04-27 DIAGNOSIS — Z859 Personal history of malignant neoplasm, unspecified: Secondary | ICD-10-CM | POA: Diagnosis not present

## 2024-04-27 LAB — BASIC METABOLIC PANEL WITH GFR
Anion gap: 12 (ref 5–15)
BUN: 22 mg/dL (ref 8–23)
CO2: 25 mmol/L (ref 22–32)
Calcium: 10.8 mg/dL — ABNORMAL HIGH (ref 8.9–10.3)
Chloride: 103 mmol/L (ref 98–111)
Creatinine, Ser: 0.77 mg/dL (ref 0.61–1.24)
GFR, Estimated: >60 mL/min (ref >60)
Glucose, Bld: 108 mg/dL — ABNORMAL HIGH (ref 70–99)
Potassium: 3.9 mmol/L (ref 3.5–5.1)
Sodium: 139 mmol/L (ref 135–145)

## 2024-04-27 LAB — TROPONIN T, HIGH SENSITIVITY
Troponin T High Sensitivity: 22 ng/L — ABNORMAL HIGH (ref 0–19)
Troponin T High Sensitivity: 18 ng/L (ref 0–19)

## 2024-04-27 LAB — CBC
HCT: 45.3 % (ref 39.0–52.0)
Hemoglobin: 15.2 g/dL (ref 13.0–17.0)
MCH: 29 pg (ref 26.0–34.0)
MCHC: 33.6 g/dL (ref 30.0–36.0)
MCV: 86.5 fL (ref 80.0–100.0)
Platelets: 222 K/uL (ref 150–400)
RBC: 5.24 MIL/uL (ref 4.22–5.81)
RDW: 12.8 % (ref 11.5–15.5)
WBC: 7.9 K/uL (ref 4.0–10.5)
nRBC: 0 % (ref 0.0–0.2)

## 2024-04-27 MED ORDER — ONDANSETRON HCL 4 MG/2ML IJ SOLN
4.0000 mg | Freq: Once | INTRAMUSCULAR | Status: DC
  Filled 2024-04-27: qty 2

## 2024-04-27 MED ORDER — IOHEXOL 350 MG/ML SOLN
100.0000 mL | Freq: Once | INTRAVENOUS | Status: AC | PRN
  Administered 2024-04-27: 100 mL via INTRAVENOUS

## 2024-04-27 MED ORDER — MORPHINE SULFATE (PF) 4 MG/ML IV SOLN
4.0000 mg | Freq: Once | INTRAVENOUS | Status: DC
  Filled 2024-04-27: qty 1

## 2024-04-27 MED ORDER — ACETAMINOPHEN 500 MG PO TABS
1000.0000 mg | ORAL_TABLET | Freq: Once | ORAL | Status: AC
  Administered 2024-04-27: 1000 mg via ORAL
  Filled 2024-04-27: qty 2

## 2024-04-27 MED ORDER — LIDOCAINE 5 % EX PTCH
1.0000 | MEDICATED_PATCH | CUTANEOUS | Status: DC
  Administered 2024-04-27: 1 via TRANSDERMAL
  Filled 2024-04-27: qty 1

## 2024-04-27 NOTE — ED Notes (Signed)
 ED Provider at bedside.

## 2024-04-27 NOTE — ED Triage Notes (Signed)
 Chest pain that travels through to his back onset Thursday. Denies SOB, nausea, or vomiting. PMH aortic aneurysm, sees cards.

## 2024-04-27 NOTE — ED Provider Notes (Signed)
 [place] EMERGENCY DEPARTMENT AT [facility] Provider Note   CSN: [id number] Arrival date & time: 04/27/24  1156     Patient presents with: Chest Pain   Edward Arroyo is a 63 y.o. male with PMHx OA, chronic back pain, GERD, HTN, IBS, who presents to ED concerned for back pain that radiates towards chest x5 days. Patient denies chest pain but states that it just feels a little weird in anterior central chest. Patient unsure what he was doing when symptoms started. Patient took a tylenol  once earlier this week and does not think that it helped his pain. Pain feels better with laying down. Severity of the pain randomly fluctuates throughout the day and is now specifically related to exertion vs sitting.  Denies fever, dyspnea, cough, nausea, vomiting, diarrhea. Denies recent surgery/immobilization, hx DVT/PE, hemoptysis, hx cancer in the past 6 months, calf swelling/tenderness.     Chest Pain      Prior to Admission medications   Medication Sig Start Date End Date Taking? Authorizing Provider  cholecalciferol (VITAMIN D) 25 MCG (1000 UNIT) tablet Take 1,000 Units by mouth daily.    [provider]  cyclobenzaprine  (FLEXERIL ) 10 MG tablet Take 1 tablet (10 mg total) by mouth 2 (two) times daily as needed for muscle spasms. Patient not taking: Reported on 02/27/2024 09/11/21   Griselda Norris, MD  ELDERBERRY PO Take 1 capsule by mouth daily.    [provider]  linaclotide (LINZESS) 72 MCG capsule Take 72 mcg by mouth daily as needed. 07/02/23   [provider]  MAGNESIUM GLUCONATE PO Take by mouth. 1 tab every morning & 2 tabs every evening    [provider]  olmesartan -hydrochlorothiazide  (BENICAR  HCT) 40-25 MG tablet Take 1 tablet by mouth daily.    [provider]  ondansetron  (ZOFRAN -ODT) 4 MG disintegrating tablet Take 1 tablet (4 mg total) by mouth every 6 (six) hours as needed for nausea or vomiting. Patient not taking: Reported  on 02/27/2024 06/27/23   Prosperi, Christian H, PA-C  vitamin C (ASCORBIC ACID) 500 MG tablet Take 500 mg by mouth daily.    [provider]  zinc gluconate 50 MG tablet Take 50 mg by mouth daily.    [provider]    Allergies: Morphine     Review of Systems  Cardiovascular:  Positive for chest pain.    Updated Vital Signs BP (!) 153/79   Pulse (!) 53   Temp 97.9 F (36.6 C) (Oral)   Resp 16   Ht 6' 2 (1.88 m)   Wt 117.9 kg   SpO2 99%   BMI 33.38 kg/m   Physical Exam Vitals and nursing note reviewed.  Constitutional:      General: He is not in acute distress.    Appearance: He is not ill-appearing or toxic-appearing.  HENT:     Head: Normocephalic and atraumatic.     Mouth/Throat:     Mouth: Mucous membranes are moist.     Pharynx: No oropharyngeal exudate or posterior oropharyngeal erythema.  Eyes:     General: No scleral icterus.       Right eye: No discharge.        Left eye: No discharge.     Conjunctiva/sclera: Conjunctivae normal.  Cardiovascular:     Rate and Rhythm: Normal rate and regular rhythm.     Pulses: Normal pulses.     Heart sounds: Normal heart sounds. No murmur heard. Pulmonary:     Effort: Pulmonary effort is  normal. No respiratory distress.     Breath sounds: Normal breath sounds. No wheezing, rhonchi or rales.  Abdominal:     General: Bowel sounds are normal.     Palpations: Abdomen is soft.     Tenderness: There is no abdominal tenderness.  Musculoskeletal:     Right lower leg: No edema.     Left lower leg: No edema.     Comments: No calf swelling or tenderness.  Mild tenderness to palpation of thoracic paraspinal muscles.  Skin:    General: Skin is warm and dry.     Findings: No rash.  Neurological:     General: No focal deficit present.     Mental Status: He is alert. Mental status is at baseline.  Psychiatric:        Mood and Affect: Mood normal.     (all labs ordered are listed, but only abnormal results are  displayed) Labs Reviewed  BASIC METABOLIC PANEL WITH GFR - Abnormal; Notable for the following components:      Result Value   Glucose, Bld 108 (*)    Calcium 10.8 (*)    All other components within normal limits  TROPONIN T, HIGH SENSITIVITY - Abnormal; Notable for the following components:   Troponin T High Sensitivity 22 (*)    All other components within normal limits  CBC  TROPONIN T, HIGH SENSITIVITY    EKG: EKG Interpretation Date/Time:  Tuesday April 27 2024 12:08:03 EST Ventricular Rate:  55 PR Interval:  160 QRS Duration:  94 QT Interval:  444 QTC Calculation: 424 R Axis:   -38  Text Interpretation: Sinus bradycardia Left axis deviation Low voltage QRS Cannot rule out Anterior infarct , age undetermined Abnormal ECG When compared with ECG of 27-Feb-2024 13:38, QT has shortened Confirmed by Dean Clarity [id number]) on 04/27/2024 3:59:36 PM  Radiology: CT Angio Chest/Abd/Pel for Dissection W and/or W/WO Result Date: 04/27/2024 CLINICAL DATA:  Chest pain radiating to the back EXAM: CT ANGIOGRAPHY CHEST, ABDOMEN AND PELVIS TECHNIQUE: Non-contrast CT of the chest was initially obtained. Multidetector CT imaging through the chest, abdomen and pelvis was performed using the standard protocol during bolus administration of intravenous contrast. Multiplanar reconstructed images and MIPs were obtained and reviewed to evaluate the vascular anatomy. RADIATION DOSE REDUCTION: This exam was performed according to the departmental dose-optimization program which includes automated exposure control, adjustment of the mA and/or kV according to patient size and/or use of iterative reconstruction technique. CONTRAST:  OMNIPAQUE  IOHEXOL  350 MG/ML SOLN COMPARISON:  CT 04/02/2024, 06/26/2023, CT angiography 09/11/2021 FINDINGS: CTA CHEST FINDINGS Cardiovascular: Non contrasted images of the chest demonstrate no acute intramural hematoma. Negative for aortic dissection. No aneurysm. Normal  cardiac size. No pericardial effusion. Minimal atherosclerosis Mediastinum/Nodes: 6 patent trachea. No suspicious thyroid mass. No suspicious lymph nodes. Esophagus within normal limits. Lungs/Pleura: No acute airspace disease, pleural effusion or pneumothorax. 5 mm left lower lobe pulmonary nodule series 3, image 99, previously 5 mm. Stable 6 mm left apical lung nodule series 12, image 18. Musculoskeletal: Sternum appears intact. No acute osseous abnormality. Review of the MIP images confirms the above findings. CTA ABDOMEN AND PELVIS FINDINGS VASCULAR Aorta: Normal caliber aorta without aneurysm, dissection, vasculitis or significant stenosis. Minimal atherosclerosis. 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 Thin linear density within the distal celiac trunk about 3 cm distal to the origin, sagittal series 11 image 122 through 125, and coronal series 10, image 108, some artifact noted in the region  and similar appearance when compared to 2023. Distal vascular patency. SMA: Patent without evidence of aneurysm, dissection, vasculitis or significant stenosis. Renals: Both renal arteries are patent without evidence of aneurysm, dissection, vasculitis, fibromuscular dysplasia or significant stenosis. IMA: Patent without evidence of aneurysm, dissection, vasculitis or significant stenosis. Inflow: Patent without evidence of aneurysm, dissection, vasculitis or significant stenosis. Veins: Suboptimally assessed Review of the MIP images confirms the above findings. NON-VASCULAR Hepatobiliary: No focal liver abnormality is seen. No gallstones, gallbladder wall thickening, or biliary dilatation. Pancreas: Unremarkable. No pancreatic ductal dilatation or surrounding inflammatory changes. Spleen: Normal in size without focal abnormality. Adrenals/Urinary Tract: Adrenal glands are normal. Left renal sinus cysts, no imaging follow-up is recommended. No hydronephrosis. The bladder is  unremarkable Stomach/Bowel: The stomach is nonenlarged. No dilated small bowel. Diverticular disease of the colon without acute inflammatory process. Appendectomy. Lymphatic: No suspicious lymph nodes Reproductive: Prostatectomy Other: Negative for ascites or free air. Musculoskeletal: Chronic sclerotic lesions in the left pubic symphysis and iliac bone. Review of the MIP images confirms the above findings. IMPRESSION: 1. Negative for acute aortic dissection or aneurysm. 2. 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 Thin linear density within the distal celiac trunk about 3 cm distal to the origin, some artifact noted in the region and similar appearance when compared to 2023. This could be due to artifact with small chronic focal dissection not excluded. 3. No CT evidence for acute intra-abdominal or pelvic abnormality. 4. Diverticular disease of the colon without acute inflammatory process. 5. Stable left lung nodules. No specific imaging follow-up is recommended 6. Aortic atherosclerosis. Aortic Atherosclerosis (ICD10-I70.0). Electronically Signed   By: Luke Bun M.D.   On: 04/27/2024 17:09   DG Chest 2 View Result Date: 04/27/2024 CLINICAL DATA:  Chest pain radiating to back. EXAM: CHEST - 2 VIEW COMPARISON:  09/10/2021 FINDINGS: Lungs are adequately inflated and otherwise clear. Cardiomediastinal silhouette and remainder of the exam is unchanged. IMPRESSION: No active cardiopulmonary disease. Electronically Signed   By: Toribio Agreste M.D.   On: 04/27/2024 13:22     Procedures   Medications Ordered in the ED  morphine  (PF) 4 MG/ML injection 4 mg (4 mg Intravenous Patient Refused/Not Given 04/27/24 1635)  ondansetron  (ZOFRAN ) injection 4 mg (4 mg Intravenous Patient Refused/Not Given 04/27/24 1635)  lidocaine  (LIDODERM ) 5 % 1 patch (1 patch Transdermal Patch Applied 04/27/24 1700)  iohexol  (OMNIPAQUE ) 350 MG/ML injection 100 mL (100 mLs Intravenous  Contrast Given 04/27/24 1614)  acetaminophen  (TYLENOL ) tablet 1,000 mg (1,000 mg Oral Given 04/27/24 1700)                                    Medical Decision Making Amount and/or Complexity of Data Reviewed Labs: ordered. Radiology: ordered.  Risk OTC drugs. Prescription drug management.   This patient presents to the ED for concern of chest pain, this involves an extensive number of treatment options, and is a complaint that carries with it a high risk of complications and morbidity.  The differential diagnosis includes acute coronary syndrome, congestive heart failure, pericarditis, pneumonia, pulmonary embolism, tension pneumothorax, esophageal rupture, aortic dissection, cardiac tamponade, musculoskeletal   Co morbidities that complicate the patient evaluation  OA, chronic back pain, GERD, HTN, IBS,    Additional history obtained:  Additional history obtained from 9/25 Cardiology note: Echo in 2023 revealed mild dilatation of ascending aorta measuring 39 mm. Echo  cannot be updated at this time as he does not have a current diagnoses that will cover this. He has upcoming CT scan of his chest that is monitoring pulmonary nodule. No significant vascular findings noted on CT scan 1 year ago. Will continue to follow and consider CT scan of aorta in 1 year for surveillance.  2023 ECHO: normal LVEF at 60 to 65%, also no significant valvular abnormalities.  Ascending aorta is mildly dilated at 39 mm    Risk Stratification Score:  Wells Score: 0   Problem List / ED Course / Critical interventions / Medication management  Patient presented for back pain radiating towards chest.  Physical exam with mild tenderness to palpation of BL thoracic paraspinal muscles.  Rest of physical exam reassuring.  Patient afebrile with stable vitals. I Ordered, and personally interpreted labs.  Initial troponin mildly elevated at 22 downtrending to 18.  CBC without leukocytosis or anemia.  BMP with  mild hyperglycemia at 108. The patient was maintained on a cardiac monitor.  I personally viewed and interpreted the EKG/cardiac monitored which showed an underlying rhythm of: Sinus bradycardia. I ordered imaging studies including chest xray and CT dissection study to assess for process contributing to patient's symptoms. I independently visualized and interpreted imaging which showed no acute process. I agree with the radiologist interpretation.  CT incidental finding of pulmonary lung nodules -patient stating that he has known about this and does repeat imaging for this. Shared all results with patient.  Answered all questions.  Provided patient with Tylenol  and lidocaine .  Patient stating that pain has resolved.  Overall, I am more suspicious for MSK related pain.  Educated patient on following up with cardiology in the next couple of days given his mild elevation of troponins.  Patient verbalized understanding of plan and is agreeable for discharge. I have reviewed the patients home medicines and have made adjustments as needed The patient has been appropriately medically screened and/or stabilized in the ED. I have low suspicion for any other emergent medical condition which would require further screening, evaluation or treatment in the ED or require inpatient management. At time of discharge the patient is hemodynamically stable and in no acute distress. I have discussed work-up results and diagnosis with patient and answered all questions. Patient is agreeable with discharge plan. We discussed strict return precautions for returning to the emergency department and they verbalized understanding.     Social Determinants of Health:  none      Final diagnoses:  Acute bilateral thoracic back pain  Pulmonary nodule    ED Discharge Orders     None          Hoy Nidia FALCON, [place] 04/27/24 1802    Dean Clarity, MD 04/27/24 2200

## 2024-04-27 NOTE — Telephone Encounter (Signed)
 Left message for patient to call back

## 2024-04-27 NOTE — Telephone Encounter (Signed)
 Pt called in asking if echo can be ordered. He states his pcp asked for it.

## 2024-04-27 NOTE — Discharge Instructions (Addendum)
 As discussed, you will need to follow-up with your primary care provider and cardiologist in the next 48-72 hours.  Seek emergency care if experiencing any new or worsening symptoms.  Alternating between 650 mg Tylenol  and 400 mg Advil: The best way to alternate taking Acetaminophen  (example Tylenol ) and Ibuprofen (example Advil/Motrin) is to take them 3 hours apart. For example, if you take ibuprofen at 6 am you can then take Tylenol  at 9 am. You can continue this regimen throughout the day, making sure you do not exceed the recommended maximum dose for each drug.

## 2024-05-04 ENCOUNTER — Encounter: Payer: Self-pay | Admitting: *Deleted

## 2024-05-04 NOTE — Telephone Encounter (Signed)
Patient notified and verbalized understanding. Appointment scheduled.

## 2024-05-04 NOTE — Telephone Encounter (Signed)
 Spoke with patient - stated he had ED visit at [facility] & CT angio on 04/27/24.    States this report states that there was no aneurysm - please review.  Also, wants to know if you need to see him any earlier than his routine f/u with [facility] for August next year.

## 2024-05-04 NOTE — Telephone Encounter (Signed)
 Left message to return call

## 2024-05-10 ENCOUNTER — Encounter: Payer: Self-pay | Admitting: Nurse Practitioner

## 2024-05-10 ENCOUNTER — Ambulatory Visit: Attending: Nurse Practitioner | Admitting: Nurse Practitioner

## 2024-05-10 ENCOUNTER — Encounter: Payer: Self-pay | Admitting: Cardiology

## 2024-05-10 VITALS — BP 122/82 | HR 52 | Ht 74.0 in | Wt 263.0 lb

## 2024-05-10 DIAGNOSIS — R918 Other nonspecific abnormal finding of lung field: Secondary | ICD-10-CM | POA: Diagnosis not present

## 2024-05-10 DIAGNOSIS — I700 Atherosclerosis of aorta: Secondary | ICD-10-CM | POA: Diagnosis not present

## 2024-05-10 DIAGNOSIS — I10 Essential (primary) hypertension: Secondary | ICD-10-CM | POA: Diagnosis not present

## 2024-05-10 NOTE — Progress Notes (Unsigned)
 Cardiology Office Note   Date:  02/27/2024 ID:  Edward Arroyo, DOB 24-Sep-1960, MRN [id number] PCP: [name] Providers Cardiologist:  Jayson Sierras, MD     History of Present Illness Edward Arroyo is a 63 y.o. male with a PMH of chest pain, borderline diabetes, hypertension, minimal aortic atherosclerosis, hepatic steatosis, spinal stenosis, history of diverticulosis, aortic dilatation, who presents today for overdue follow-up.  Last seen by Dr. Sierras on November 22, 2021.  At that time, patient noted difficulty with insomnia, elevated BPs, fatigue in his chest when waking up in the morning, denied any reproducible chest discomfort at the time.  Has spoken with his PCP at the time about possible sleep apnea evaluation.  Noted to have a family history of premature CAD with 2 of his brothers.  Ischemic evaluation was discussed with patient at this visit, as well as echocardiogram.  I do not see where this ischemic evaluation was done per chart review.  See echocardiogram report below.   Today he presents for overdue follow-up.  He states he is doing well. Tells me he has been helping his wife who has undergone surgery for retinal detachment 3 times within the past year. Overall doing well from a cardiac perspective.  Tells me his PCP states he needs to have his aorta checked. Denies any chest pain, shortness of breath, palpitations, syncope, presyncope, dizziness, orthopnea, PND, swelling or significant weight changes, acute bleeding, or claudication.     SH: Enjoys gardening, fishing, and being outdoors.  ROS: Negative.  See HPI.  Studies Reviewed  EKG:      Echo 2021-12-16:  1. Left ventricular ejection fraction, by estimation, is 60 to 65%. The  left ventricle has normal function. The left ventricle has no regional  wall motion abnormalities. Left ventricular diastolic parameters are  indeterminate.   2. Right  ventricular systolic function is normal. The right ventricular  size is normal. Tricuspid regurgitation signal is inadequate for assessing  PA pressure.   3. The mitral valve is normal in structure. Trivial mitral valve  regurgitation. No evidence of mitral stenosis.   4. The aortic valve is tricuspid. Aortic valve regurgitation is not  visualized. No aortic stenosis is present.   5. Aortic dilatation noted. There is mild dilatation of the ascending  aorta, measuring 39 mm.  Physical Exam VS:  There were no vitals taken for this visit.       Wt Readings from Last 3 Encounters:  04/27/24 260 lb (117.9 kg)  02/27/24 272 lb 3.2 oz (123.5 kg)  06/27/23 255 lb (115.7 kg)    GEN: Well nourished, well developed in no acute distress NECK: No JVD; No carotid bruits CARDIAC: S1/S2, slow rate and regular rhythm, no murmurs, rubs, gallops RESPIRATORY:  Clear to auscultation without rales, wheezing or rhonchi  ABDOMEN: Soft, non-tender, non-distended EXTREMITIES:  No edema; No deformity   ASSESSMENT AND PLAN  HTN BP is well controlled per his report. Discussed SBP < 130. Discussed to monitor BP at home at least 2 hours after medications and sitting for 5-10 minutes.  Discussed when to notify our office.  He verbalized understanding. Heart healthy diet and regular cardiovascular exercise encouraged.   Minimal aortic atherosclerosis Continue current medication regimen. Heart healthy diet and regular cardiovascular exercise encouraged.  Continue to follow with PCP.  Aortic dilatation Echo in 2023 revealed mild dilatation of ascending aorta measuring 39 mm. Echo cannot be updated at this time as  he does not have a current diagnoses that will cover this. He has upcoming CT scan of his chest that is monitoring pulmonary nodule. No significant vascular findings noted on CT scan 1 year ago. Will continue to follow and consider CT scan of aorta in 1 year for surveillance.       I spent a total  duration of 20 minutes reviewing prior notes, reviewing outside records including  labs, EKG today, face-to-face counseling of medical condition, pathophysiology, evaluation, management, and documenting the findings in the note.    Dispo: Care and ED precautions discussed. Follow-up with MD/APP in 1 year or sooner if anything changes.   Signed, Almarie Crate, NP

## 2024-05-10 NOTE — Patient Instructions (Signed)

## 2024-06-10 ENCOUNTER — Ambulatory Visit: Admitting: Nurse Practitioner
